# Patient Record
Sex: Male | Born: 1960 | Race: Black or African American | Hispanic: No | Marital: Single | Smoking: Former smoker
Health system: Southern US, Community
[De-identification: ages and names within clinical notes are randomized; demographics above are authoritative.]

## PROBLEM LIST (undated history)

## (undated) DIAGNOSIS — Z9889 Other specified postprocedural states: Secondary | ICD-10-CM

## (undated) DIAGNOSIS — M1990 Unspecified osteoarthritis, unspecified site: Secondary | ICD-10-CM

## (undated) DIAGNOSIS — G8929 Other chronic pain: Secondary | ICD-10-CM

## (undated) DIAGNOSIS — E785 Hyperlipidemia, unspecified: Secondary | ICD-10-CM

## (undated) DIAGNOSIS — Z91199 Patient's noncompliance with other medical treatment and regimen due to unspecified reason: Secondary | ICD-10-CM

## (undated) DIAGNOSIS — F329 Major depressive disorder, single episode, unspecified: Secondary | ICD-10-CM

## (undated) DIAGNOSIS — M722 Plantar fascial fibromatosis: Secondary | ICD-10-CM

## (undated) DIAGNOSIS — G4730 Sleep apnea, unspecified: Secondary | ICD-10-CM

## (undated) DIAGNOSIS — F419 Anxiety disorder, unspecified: Secondary | ICD-10-CM

## (undated) DIAGNOSIS — F32A Depression, unspecified: Secondary | ICD-10-CM

## (undated) DIAGNOSIS — R945 Abnormal results of liver function studies: Secondary | ICD-10-CM

## (undated) DIAGNOSIS — A64 Unspecified sexually transmitted disease: Secondary | ICD-10-CM

## (undated) DIAGNOSIS — M5126 Other intervertebral disc displacement, lumbar region: Secondary | ICD-10-CM

## (undated) DIAGNOSIS — I10 Essential (primary) hypertension: Secondary | ICD-10-CM

## (undated) DIAGNOSIS — F1010 Alcohol abuse, uncomplicated: Secondary | ICD-10-CM

## (undated) DIAGNOSIS — H409 Unspecified glaucoma: Secondary | ICD-10-CM

## (undated) DIAGNOSIS — I2510 Atherosclerotic heart disease of native coronary artery without angina pectoris: Secondary | ICD-10-CM

## (undated) DIAGNOSIS — E669 Obesity, unspecified: Secondary | ICD-10-CM

## (undated) DIAGNOSIS — Z9119 Patient's noncompliance with other medical treatment and regimen: Secondary | ICD-10-CM

## (undated) DIAGNOSIS — H269 Unspecified cataract: Secondary | ICD-10-CM

## (undated) HISTORY — PX: POLYPECTOMY: SHX149

## (undated) HISTORY — DX: Other specified postprocedural states: Z98.890

## (undated) HISTORY — DX: Patient's noncompliance with other medical treatment and regimen due to unspecified reason: Z91.199

## (undated) HISTORY — DX: Essential (primary) hypertension: I10

## (undated) HISTORY — DX: Patient's noncompliance with other medical treatment and regimen: Z91.19

## (undated) HISTORY — DX: Major depressive disorder, single episode, unspecified: F32.9

## (undated) HISTORY — DX: Atherosclerotic heart disease of native coronary artery without angina pectoris: I25.10

## (undated) HISTORY — DX: Unspecified cataract: H26.9

## (undated) HISTORY — DX: Other intervertebral disc displacement, lumbar region: M51.26

## (undated) HISTORY — DX: Anxiety disorder, unspecified: F41.9

## (undated) HISTORY — DX: Alcohol abuse, uncomplicated: F10.10

## (undated) HISTORY — DX: Plantar fascial fibromatosis: M72.2

## (undated) HISTORY — DX: Obesity, unspecified: E66.9

## (undated) HISTORY — DX: Hyperlipidemia, unspecified: E78.5

## (undated) HISTORY — PX: COLONOSCOPY: SHX174

## (undated) HISTORY — DX: Unspecified sexually transmitted disease: A64

## (undated) HISTORY — DX: Sleep apnea, unspecified: G47.30

## (undated) HISTORY — PX: APPENDECTOMY: SHX54

## (undated) HISTORY — DX: Depression, unspecified: F32.A

## (undated) HISTORY — DX: Unspecified glaucoma: H40.9

## (undated) HISTORY — DX: Abnormal results of liver function studies: R94.5

## (undated) HISTORY — DX: Unspecified osteoarthritis, unspecified site: M19.90

---

## 1998-06-22 ENCOUNTER — Emergency Department (HOSPITAL_COMMUNITY): Admission: EM | Admit: 1998-06-22 | Discharge: 1998-06-22 | Payer: Self-pay | Admitting: Emergency Medicine

## 1998-08-23 ENCOUNTER — Emergency Department (HOSPITAL_COMMUNITY): Admission: EM | Admit: 1998-08-23 | Discharge: 1998-08-23 | Payer: Self-pay | Admitting: *Deleted

## 1998-09-01 ENCOUNTER — Emergency Department (HOSPITAL_COMMUNITY): Admission: EM | Admit: 1998-09-01 | Discharge: 1998-09-01 | Payer: Self-pay | Admitting: Emergency Medicine

## 1998-09-05 ENCOUNTER — Emergency Department (HOSPITAL_COMMUNITY): Admission: EM | Admit: 1998-09-05 | Discharge: 1998-09-05 | Payer: Self-pay | Admitting: Emergency Medicine

## 1998-11-18 ENCOUNTER — Emergency Department (HOSPITAL_COMMUNITY): Admission: EM | Admit: 1998-11-18 | Discharge: 1998-11-18 | Payer: Self-pay | Admitting: Emergency Medicine

## 1999-08-26 ENCOUNTER — Emergency Department (HOSPITAL_COMMUNITY): Admission: EM | Admit: 1999-08-26 | Discharge: 1999-08-26 | Payer: Self-pay | Admitting: Emergency Medicine

## 1999-11-23 ENCOUNTER — Emergency Department (HOSPITAL_COMMUNITY): Admission: EM | Admit: 1999-11-23 | Discharge: 1999-11-23 | Payer: Self-pay | Admitting: Emergency Medicine

## 1999-11-30 ENCOUNTER — Emergency Department (HOSPITAL_COMMUNITY): Admission: EM | Admit: 1999-11-30 | Discharge: 1999-11-30 | Payer: Self-pay | Admitting: *Deleted

## 2000-05-23 ENCOUNTER — Emergency Department (HOSPITAL_COMMUNITY): Admission: EM | Admit: 2000-05-23 | Discharge: 2000-05-23 | Payer: Self-pay | Admitting: Emergency Medicine

## 2001-02-25 ENCOUNTER — Emergency Department (HOSPITAL_COMMUNITY): Admission: EM | Admit: 2001-02-25 | Discharge: 2001-02-25 | Payer: Self-pay | Admitting: Emergency Medicine

## 2001-06-12 ENCOUNTER — Emergency Department (HOSPITAL_COMMUNITY): Admission: EM | Admit: 2001-06-12 | Discharge: 2001-06-12 | Payer: Self-pay | Admitting: Emergency Medicine

## 2001-10-06 ENCOUNTER — Emergency Department: Admission: EM | Admit: 2001-10-06 | Discharge: 2001-10-06 | Payer: Self-pay | Admitting: Emergency Medicine

## 2001-12-03 ENCOUNTER — Emergency Department (HOSPITAL_COMMUNITY): Admission: EM | Admit: 2001-12-03 | Discharge: 2001-12-04 | Payer: Self-pay | Admitting: Emergency Medicine

## 2001-12-27 ENCOUNTER — Encounter: Payer: Self-pay | Admitting: Emergency Medicine

## 2001-12-27 ENCOUNTER — Emergency Department (HOSPITAL_COMMUNITY): Admission: EM | Admit: 2001-12-27 | Discharge: 2001-12-27 | Payer: Self-pay | Admitting: Emergency Medicine

## 2002-01-07 ENCOUNTER — Emergency Department (HOSPITAL_COMMUNITY): Admission: EM | Admit: 2002-01-07 | Discharge: 2002-01-07 | Payer: Self-pay | Admitting: Emergency Medicine

## 2002-10-20 ENCOUNTER — Emergency Department (HOSPITAL_COMMUNITY): Admission: EM | Admit: 2002-10-20 | Discharge: 2002-10-20 | Payer: Self-pay | Admitting: Emergency Medicine

## 2002-10-20 ENCOUNTER — Encounter: Payer: Self-pay | Admitting: Emergency Medicine

## 2002-11-12 ENCOUNTER — Encounter: Payer: Self-pay | Admitting: Emergency Medicine

## 2002-11-12 ENCOUNTER — Emergency Department (HOSPITAL_COMMUNITY): Admission: EM | Admit: 2002-11-12 | Discharge: 2002-11-13 | Payer: Self-pay | Admitting: Emergency Medicine

## 2003-07-04 ENCOUNTER — Emergency Department (HOSPITAL_COMMUNITY): Admission: EM | Admit: 2003-07-04 | Discharge: 2003-07-04 | Payer: Self-pay | Admitting: Emergency Medicine

## 2003-07-07 ENCOUNTER — Emergency Department (HOSPITAL_COMMUNITY): Admission: EM | Admit: 2003-07-07 | Discharge: 2003-07-07 | Payer: Self-pay

## 2003-07-31 ENCOUNTER — Emergency Department (HOSPITAL_COMMUNITY): Admission: EM | Admit: 2003-07-31 | Discharge: 2003-07-31 | Payer: Self-pay | Admitting: Emergency Medicine

## 2004-05-03 ENCOUNTER — Ambulatory Visit: Payer: Self-pay | Admitting: Family Medicine

## 2004-06-02 ENCOUNTER — Emergency Department (HOSPITAL_COMMUNITY): Admission: EM | Admit: 2004-06-02 | Discharge: 2004-06-02 | Payer: Self-pay | Admitting: Emergency Medicine

## 2004-06-22 ENCOUNTER — Ambulatory Visit: Payer: Self-pay | Admitting: Family Medicine

## 2004-09-17 ENCOUNTER — Emergency Department (HOSPITAL_COMMUNITY): Admission: EM | Admit: 2004-09-17 | Discharge: 2004-09-17 | Payer: Self-pay | Admitting: *Deleted

## 2004-12-15 ENCOUNTER — Emergency Department (HOSPITAL_COMMUNITY): Admission: EM | Admit: 2004-12-15 | Discharge: 2004-12-15 | Payer: Self-pay | Admitting: Emergency Medicine

## 2004-12-30 ENCOUNTER — Emergency Department (HOSPITAL_COMMUNITY): Admission: EM | Admit: 2004-12-30 | Discharge: 2004-12-30 | Payer: Self-pay | Admitting: Emergency Medicine

## 2005-03-14 ENCOUNTER — Inpatient Hospital Stay (HOSPITAL_COMMUNITY): Admission: EM | Admit: 2005-03-14 | Discharge: 2005-03-14 | Payer: Self-pay | Admitting: Emergency Medicine

## 2005-05-23 HISTORY — PX: CARDIAC CATHETERIZATION: SHX172

## 2005-07-27 ENCOUNTER — Ambulatory Visit: Payer: Self-pay | Admitting: Family Medicine

## 2005-09-05 ENCOUNTER — Ambulatory Visit: Payer: Self-pay | Admitting: Family Medicine

## 2005-09-12 ENCOUNTER — Ambulatory Visit: Payer: Self-pay | Admitting: Family Medicine

## 2005-09-19 ENCOUNTER — Ambulatory Visit: Payer: Self-pay | Admitting: Family Medicine

## 2005-10-21 ENCOUNTER — Emergency Department (HOSPITAL_COMMUNITY): Admission: EM | Admit: 2005-10-21 | Discharge: 2005-10-21 | Payer: Self-pay | Admitting: Emergency Medicine

## 2006-02-01 ENCOUNTER — Ambulatory Visit: Payer: Self-pay | Admitting: Family Medicine

## 2006-02-02 ENCOUNTER — Emergency Department (HOSPITAL_COMMUNITY): Admission: EM | Admit: 2006-02-02 | Discharge: 2006-02-02 | Payer: Self-pay | Admitting: Emergency Medicine

## 2006-02-14 ENCOUNTER — Ambulatory Visit: Payer: Self-pay | Admitting: *Deleted

## 2006-02-15 ENCOUNTER — Ambulatory Visit: Payer: Self-pay | Admitting: Family Medicine

## 2006-02-28 ENCOUNTER — Encounter: Admission: RE | Admit: 2006-02-28 | Discharge: 2006-03-15 | Payer: Self-pay | Admitting: Orthopedic Surgery

## 2006-03-22 ENCOUNTER — Ambulatory Visit: Payer: Self-pay | Admitting: Family Medicine

## 2006-04-02 ENCOUNTER — Ambulatory Visit: Payer: Self-pay | Admitting: Cardiovascular Disease

## 2006-04-02 ENCOUNTER — Inpatient Hospital Stay (HOSPITAL_COMMUNITY): Admission: EM | Admit: 2006-04-02 | Discharge: 2006-04-05 | Payer: Self-pay | Admitting: Emergency Medicine

## 2006-04-17 ENCOUNTER — Ambulatory Visit: Payer: Self-pay | Admitting: Cardiovascular Disease

## 2006-04-18 ENCOUNTER — Ambulatory Visit (HOSPITAL_COMMUNITY): Admission: RE | Admit: 2006-04-18 | Discharge: 2006-04-18 | Payer: Self-pay | Admitting: Cardiovascular Disease

## 2006-04-25 ENCOUNTER — Ambulatory Visit: Payer: Self-pay | Admitting: Family Medicine

## 2006-05-04 ENCOUNTER — Ambulatory Visit: Payer: Self-pay | Admitting: Family Medicine

## 2006-05-22 ENCOUNTER — Ambulatory Visit: Payer: Self-pay | Admitting: Family Medicine

## 2006-05-27 ENCOUNTER — Emergency Department (HOSPITAL_COMMUNITY): Admission: EM | Admit: 2006-05-27 | Discharge: 2006-05-27 | Payer: Self-pay | Admitting: Emergency Medicine

## 2006-06-03 ENCOUNTER — Emergency Department (HOSPITAL_COMMUNITY): Admission: EM | Admit: 2006-06-03 | Discharge: 2006-06-03 | Payer: Self-pay | Admitting: Emergency Medicine

## 2006-06-16 ENCOUNTER — Emergency Department (HOSPITAL_COMMUNITY): Admission: EM | Admit: 2006-06-16 | Discharge: 2006-06-17 | Payer: Self-pay | Admitting: Emergency Medicine

## 2006-06-20 ENCOUNTER — Ambulatory Visit: Payer: Self-pay | Admitting: Family Medicine

## 2006-06-21 ENCOUNTER — Ambulatory Visit: Payer: Self-pay | Admitting: Family Medicine

## 2006-07-17 ENCOUNTER — Encounter: Admission: RE | Admit: 2006-07-17 | Discharge: 2006-08-09 | Payer: Self-pay | Admitting: Orthopedic Surgery

## 2006-10-02 ENCOUNTER — Emergency Department (HOSPITAL_COMMUNITY): Admission: EM | Admit: 2006-10-02 | Discharge: 2006-10-02 | Payer: Self-pay | Admitting: Emergency Medicine

## 2006-10-22 IMAGING — CR DG CHEST 2V
2 series · 2 of 2 positions shown · non-contrast
Comparison: none

CLINICAL DATA: accidentally ingested rat poisoning
 TWO VIEW CHEST:
 There are mildly accentuated bronchovascular markings.  There are no infiltrates and the heart and mediastinal structures are normal.

[view not recorded (1 of 2)]
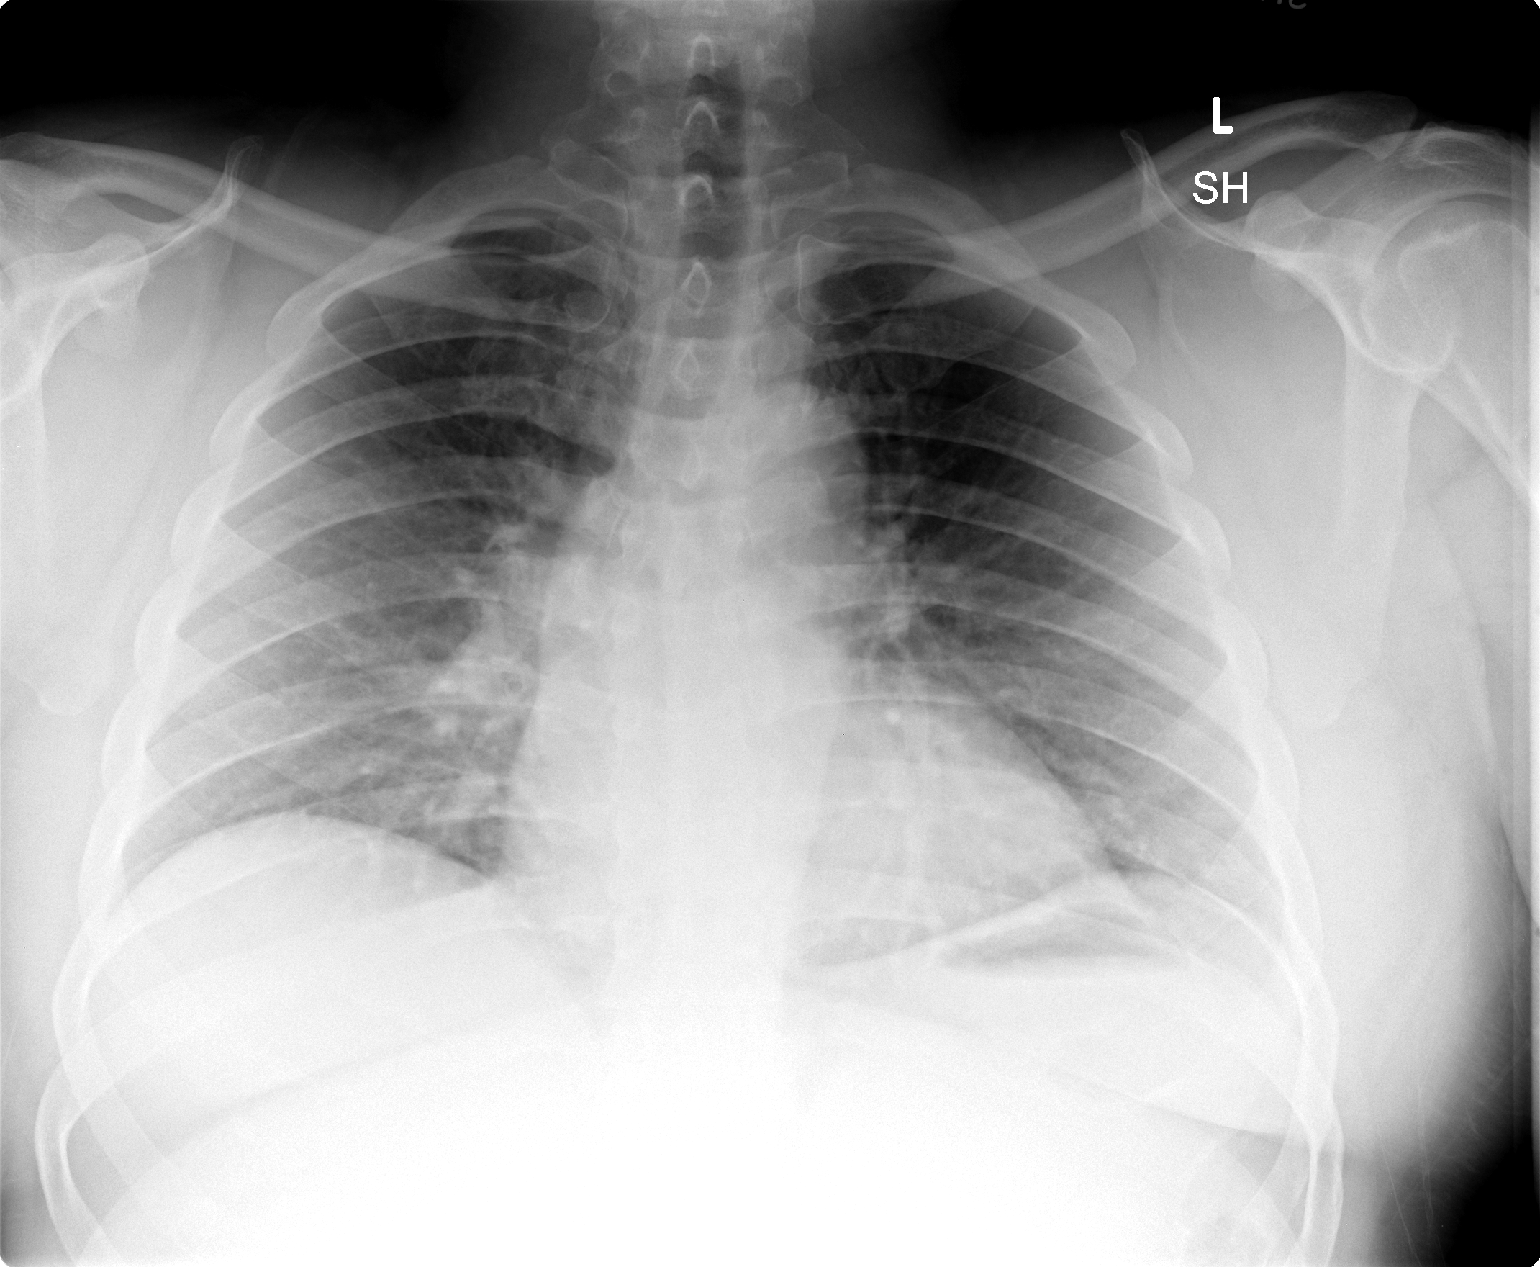

[view not recorded (2 of 2)]
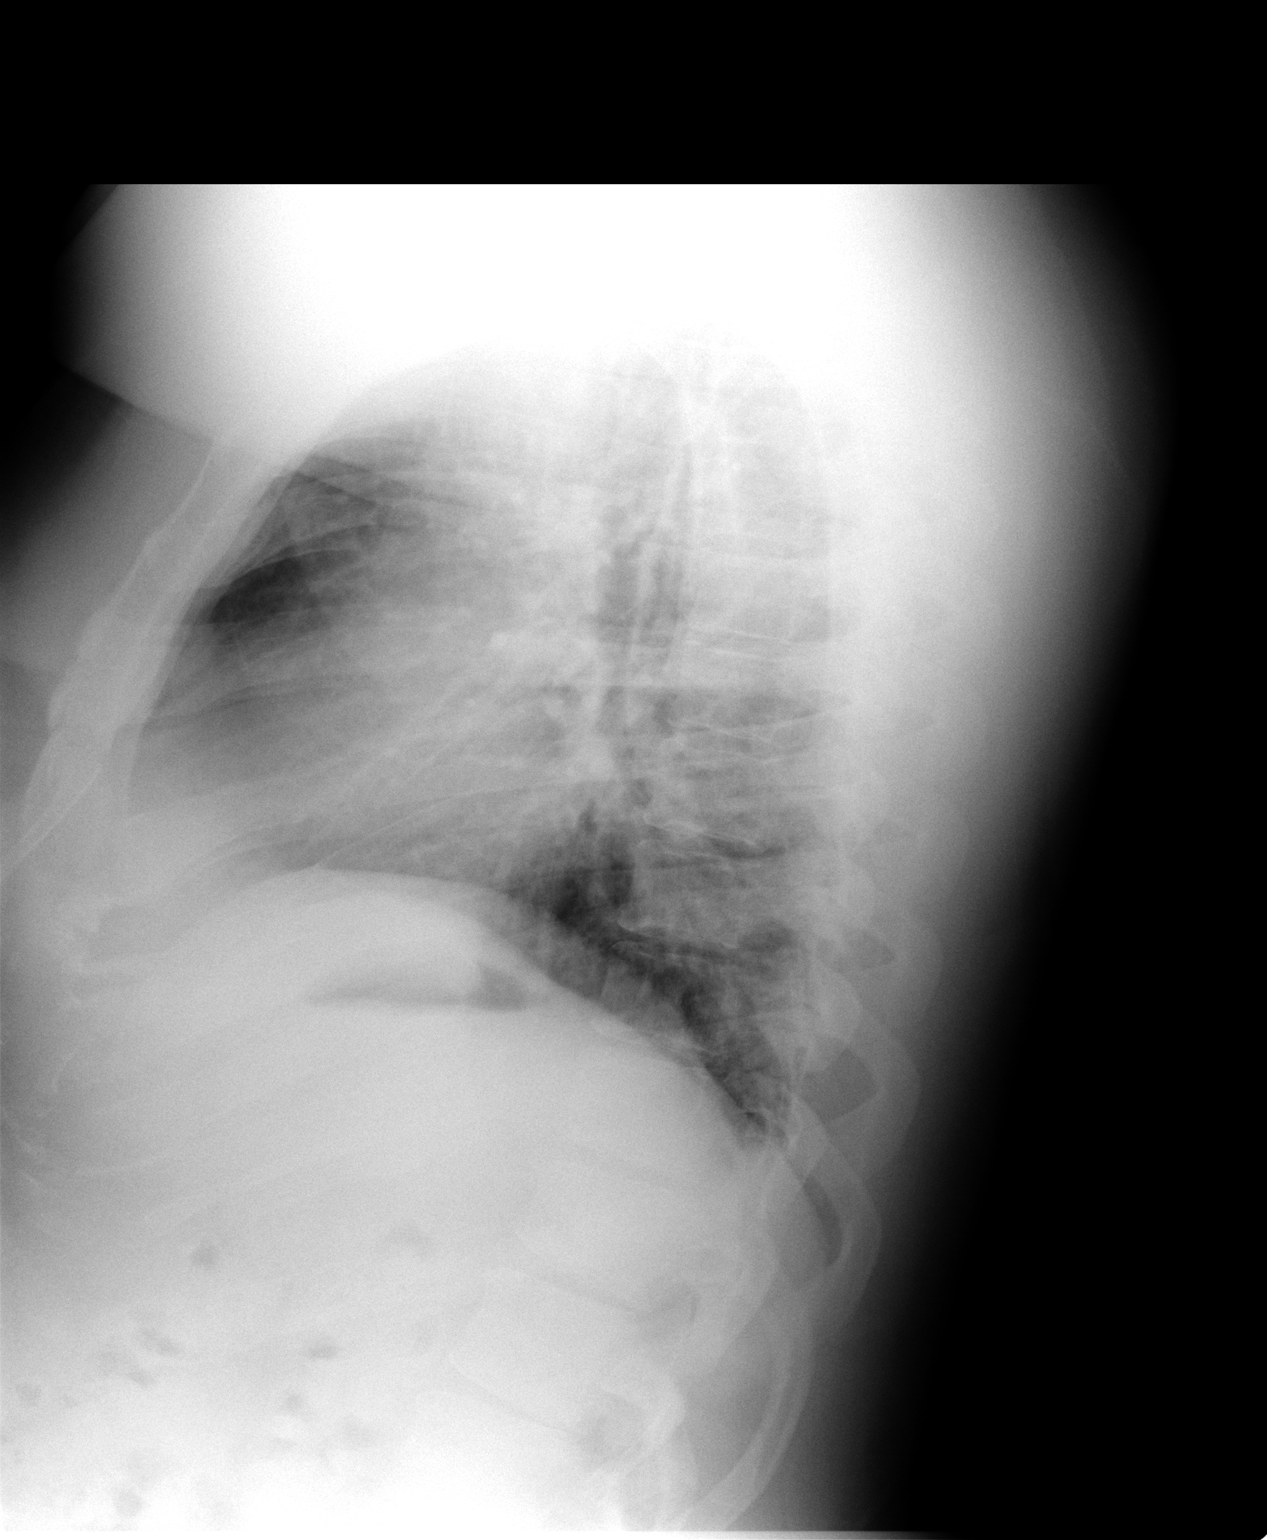

[2 of 2 positions shown; findings below may reference images not displayed]

IMPRESSION: Mildly accentuated bronchovascular markings.  No acute infiltrates.

## 2006-10-22 IMAGING — CT CT HEAD W/O CM
1 series · 16 of 30 positions shown, 20 images · IV contrast (agent unspecified)
Comparison: 07/07/03
 Routine noncontrast head CT was performed.

CLINICAL DATA: ingested rat poison; left-sided numbness
 CT HEAD WITHOUT CONTRAST:

[Series 2: head routi 5.0 h30s · axial · 0.43mm/px · z∈[+1079,+1219]mm · 16 of 32 slices shown, 20 images]
[im 2/32  brain]
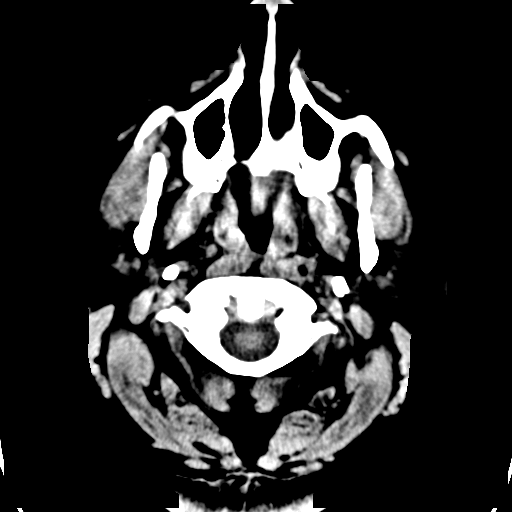
[im 2/32  bone]
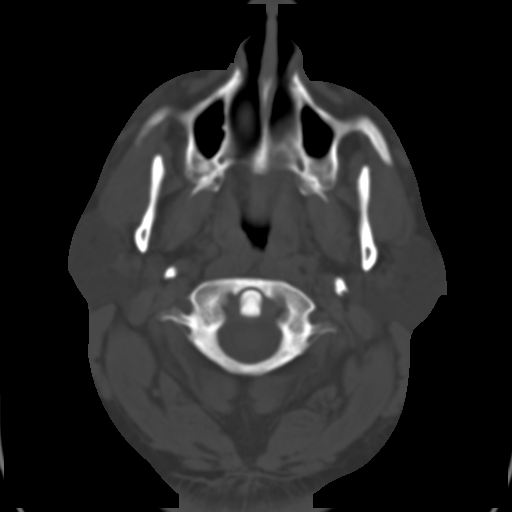
[im 4/32  brain]
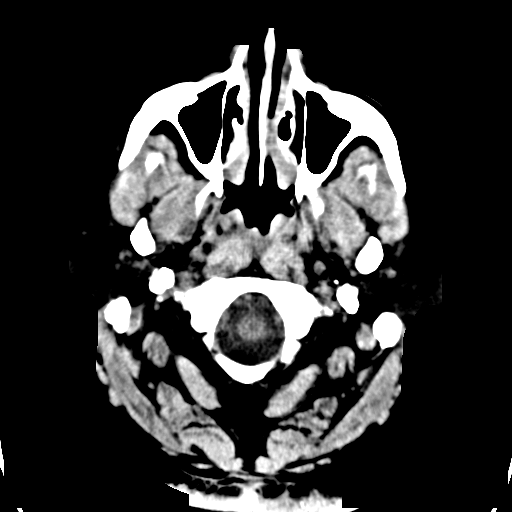
[im 6/32  brain]
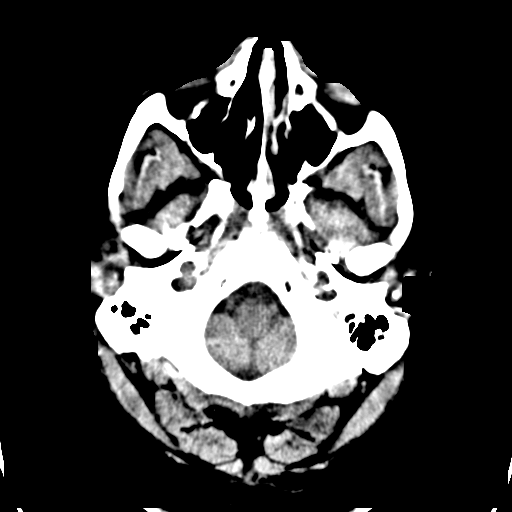
[im 8/32  brain]
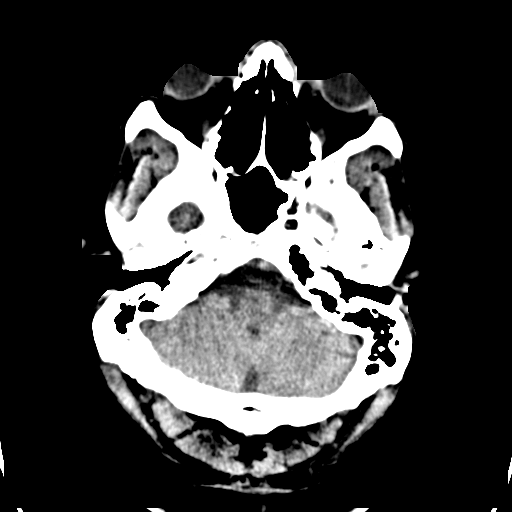
[im 9/32  brain]
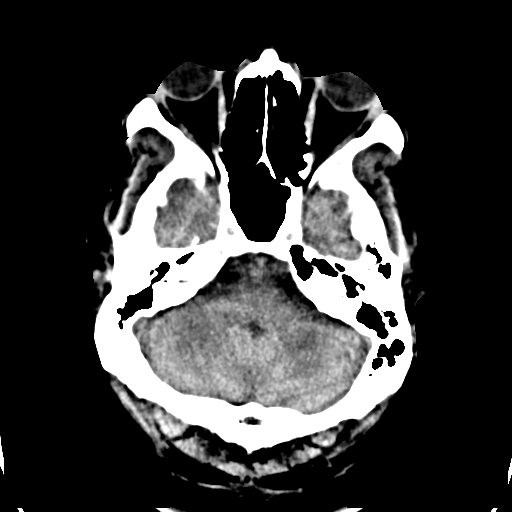
[im 9/32  bone]
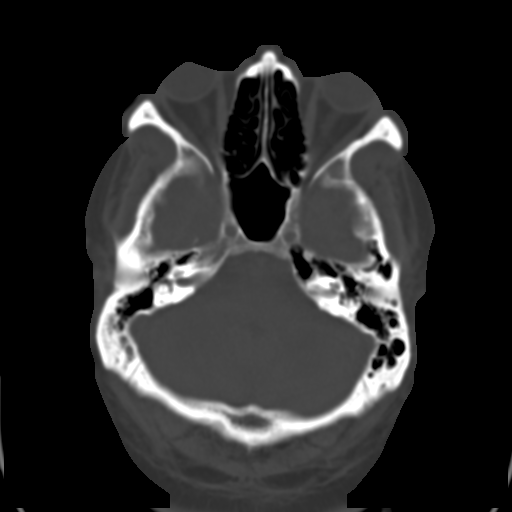
[im 11/32  brain]
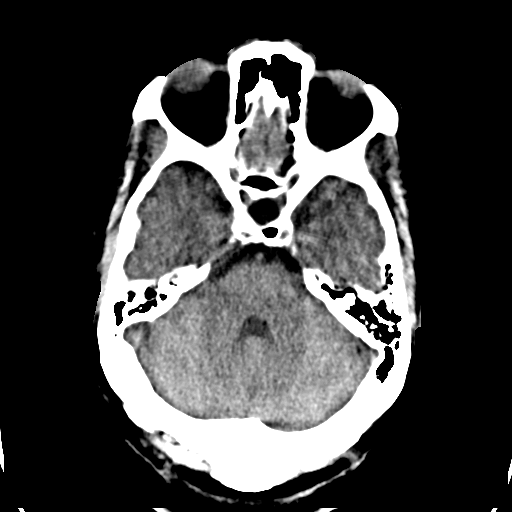
[im 13/32  brain]
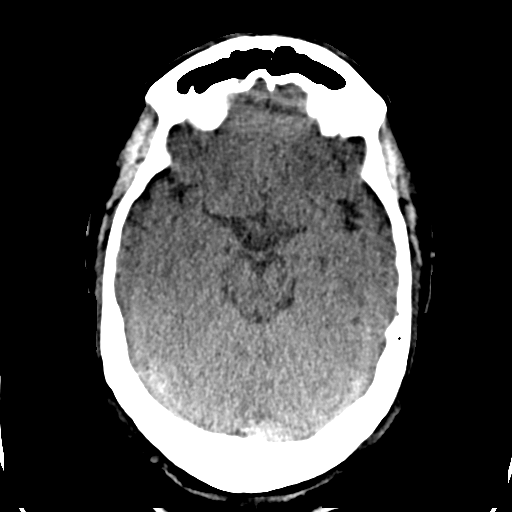
[im 15/32  brain]
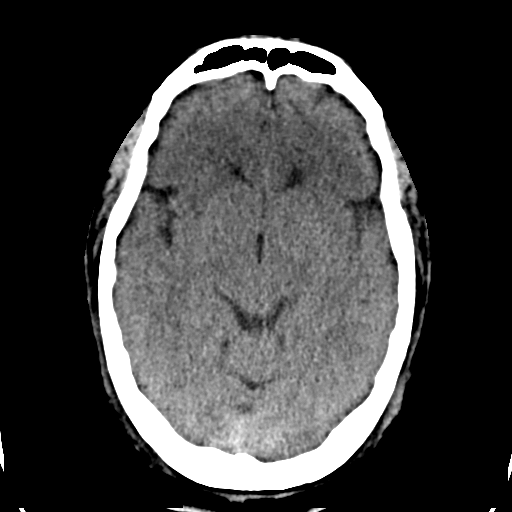
[im 17/32  brain]
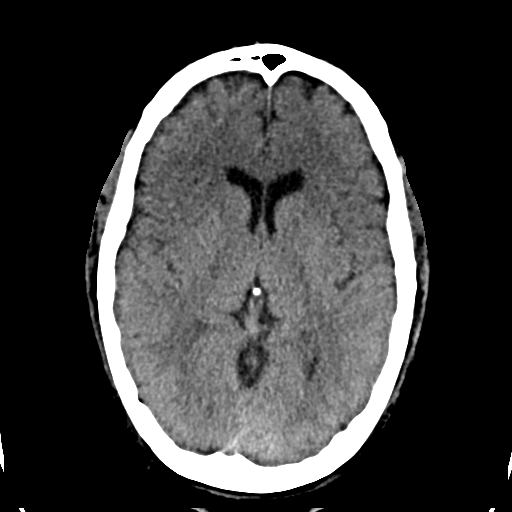
[im 17/32  bone]
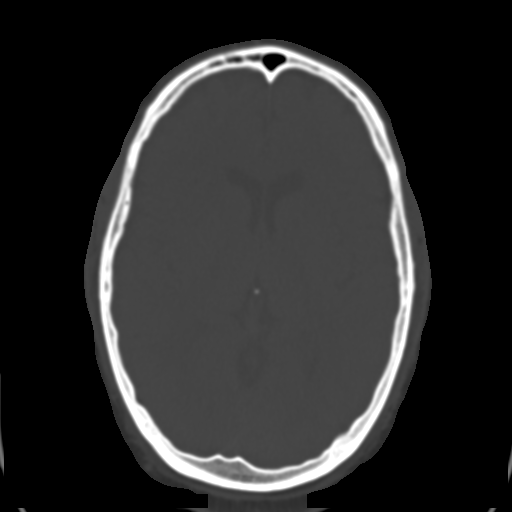
[im 19/32  brain]
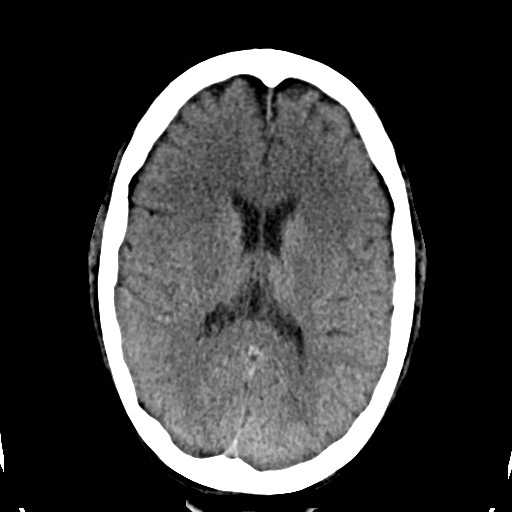
[im 21/32  brain]
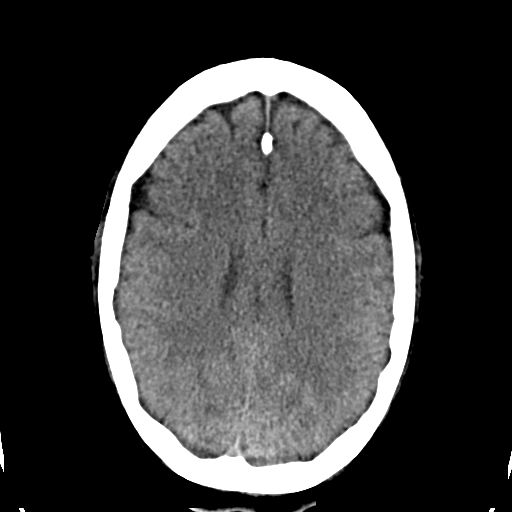
[im 23/32  brain]
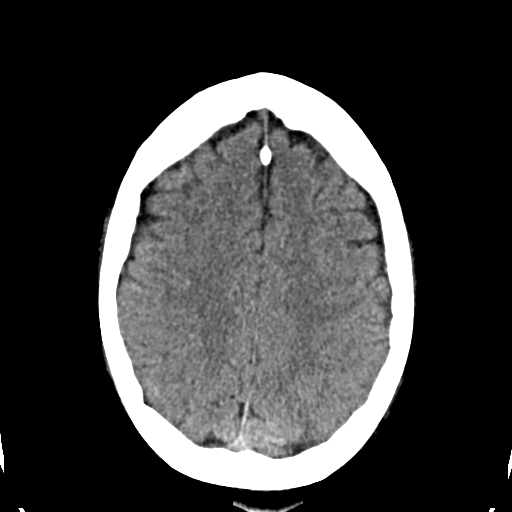
[im 24/32  brain]
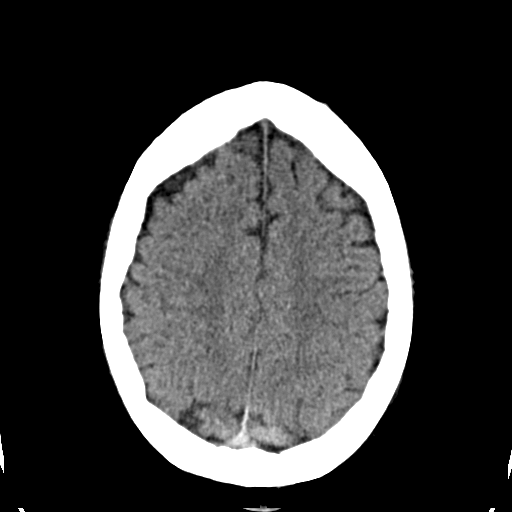
[im 24/32  bone]
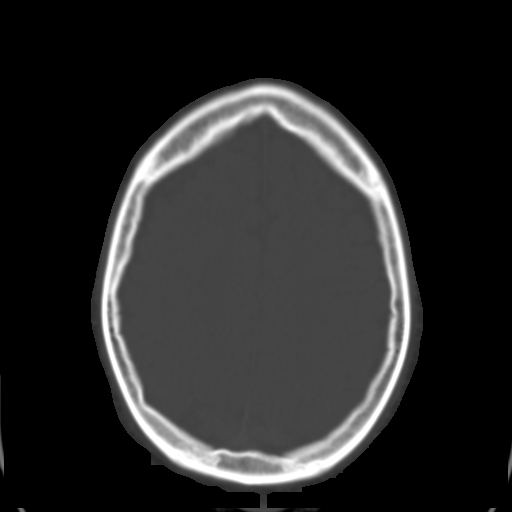
[im 26/32  brain]
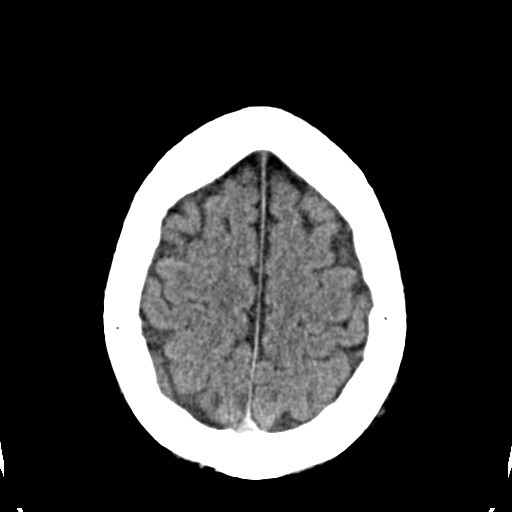
[im 28/32  brain]
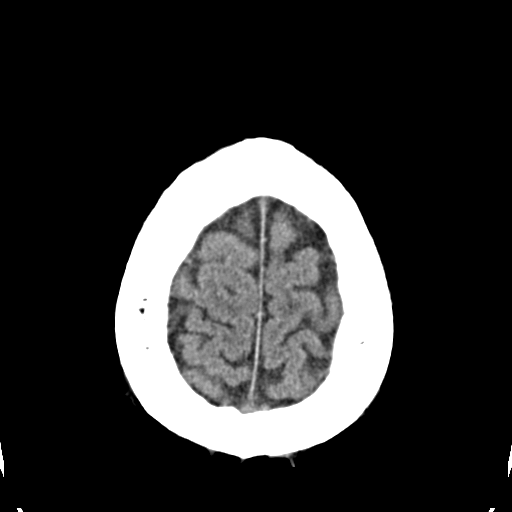
[im 30/32  brain]
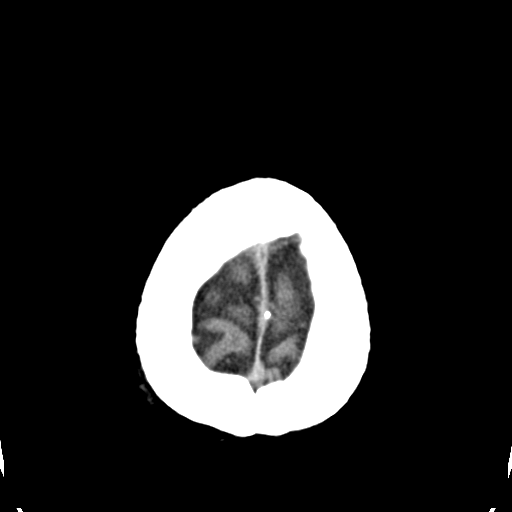

[16 of 30 positions shown; findings below may reference images not displayed]

There is no evidence of intracranial hemorrhage, brain edema, or mass effect. The ventricles are normal. No extra-axial abnormalities are identified. Bone windows show no significant abnormalities.
IMPRESSION: Negative noncontrast head CT.

## 2006-10-28 ENCOUNTER — Emergency Department (HOSPITAL_COMMUNITY): Admission: EM | Admit: 2006-10-28 | Discharge: 2006-10-29 | Payer: Self-pay | Admitting: Emergency Medicine

## 2006-11-01 DIAGNOSIS — M5126 Other intervertebral disc displacement, lumbar region: Secondary | ICD-10-CM

## 2006-11-01 DIAGNOSIS — E119 Type 2 diabetes mellitus without complications: Secondary | ICD-10-CM

## 2006-11-01 DIAGNOSIS — Z9119 Patient's noncompliance with other medical treatment and regimen: Secondary | ICD-10-CM

## 2006-11-01 DIAGNOSIS — A64 Unspecified sexually transmitted disease: Secondary | ICD-10-CM | POA: Insufficient documentation

## 2006-11-01 DIAGNOSIS — F1920 Other psychoactive substance dependence, uncomplicated: Secondary | ICD-10-CM | POA: Insufficient documentation

## 2006-11-01 DIAGNOSIS — M722 Plantar fascial fibromatosis: Secondary | ICD-10-CM | POA: Insufficient documentation

## 2006-11-01 DIAGNOSIS — R945 Abnormal results of liver function studies: Secondary | ICD-10-CM | POA: Insufficient documentation

## 2006-11-01 DIAGNOSIS — I10 Essential (primary) hypertension: Secondary | ICD-10-CM

## 2006-11-01 DIAGNOSIS — F1010 Alcohol abuse, uncomplicated: Secondary | ICD-10-CM | POA: Insufficient documentation

## 2006-11-01 DIAGNOSIS — Z981 Arthrodesis status: Secondary | ICD-10-CM

## 2006-11-14 ENCOUNTER — Emergency Department (HOSPITAL_COMMUNITY): Admission: EM | Admit: 2006-11-14 | Discharge: 2006-11-14 | Payer: Self-pay | Admitting: Emergency Medicine

## 2006-11-28 ENCOUNTER — Ambulatory Visit: Payer: Self-pay | Admitting: Internal Medicine

## 2006-12-02 ENCOUNTER — Observation Stay (HOSPITAL_COMMUNITY): Admission: EM | Admit: 2006-12-02 | Discharge: 2006-12-03 | Payer: Self-pay | Admitting: Emergency Medicine

## 2007-01-15 ENCOUNTER — Ambulatory Visit: Payer: Self-pay | Admitting: Internal Medicine

## 2007-01-15 LAB — CONVERTED CEMR LAB
ALT: 37 units/L (ref 0–53)
AST: 30 units/L (ref 0–37)
Albumin: 4.1 g/dL (ref 3.5–5.2)
Alkaline Phosphatase: 86 units/L (ref 39–117)
BUN: 11 mg/dL (ref 6–23)
CO2: 25 meq/L (ref 19–32)
Calcium: 9.3 mg/dL (ref 8.4–10.5)
Chloride: 104 meq/L (ref 96–112)
Cholesterol: 164 mg/dL (ref 0–200)
Creatinine, Ser: 0.82 mg/dL (ref 0.40–1.50)
Glucose, Bld: 107 mg/dL — ABNORMAL HIGH (ref 70–99)
HDL: 40 mg/dL (ref >39)
LDL Cholesterol: 106 mg/dL — ABNORMAL HIGH (ref 0–99)
Microalb, Ur: 1.68 mg/dL (ref 0.00–1.89)
Potassium: 4.2 meq/L (ref 3.5–5.3)
Sodium: 143 meq/L (ref 135–145)
Total Bilirubin: 0.4 mg/dL (ref 0.3–1.2)
Total CHOL/HDL Ratio: 4.1
Total Protein: 7.3 g/dL (ref 6.0–8.3)
Triglycerides: 92 mg/dL (ref <150)
VLDL: 18 mg/dL (ref 0–40)

## 2007-02-07 ENCOUNTER — Encounter (INDEPENDENT_AMBULATORY_CARE_PROVIDER_SITE_OTHER): Payer: Self-pay | Admitting: *Deleted

## 2007-03-21 ENCOUNTER — Emergency Department (HOSPITAL_COMMUNITY): Admission: EM | Admit: 2007-03-21 | Discharge: 2007-03-21 | Payer: Self-pay | Admitting: Emergency Medicine

## 2007-03-30 ENCOUNTER — Ambulatory Visit: Payer: Self-pay | Admitting: Internal Medicine

## 2007-05-09 ENCOUNTER — Encounter (INDEPENDENT_AMBULATORY_CARE_PROVIDER_SITE_OTHER): Payer: Self-pay | Admitting: Family Medicine

## 2007-05-09 ENCOUNTER — Ambulatory Visit: Payer: Self-pay | Admitting: Internal Medicine

## 2007-07-01 ENCOUNTER — Emergency Department (HOSPITAL_COMMUNITY): Admission: EM | Admit: 2007-07-01 | Discharge: 2007-07-01 | Payer: Self-pay | Admitting: Emergency Medicine

## 2007-07-05 ENCOUNTER — Emergency Department (HOSPITAL_COMMUNITY): Admission: EM | Admit: 2007-07-05 | Discharge: 2007-07-05 | Payer: Self-pay | Admitting: Emergency Medicine

## 2007-07-20 ENCOUNTER — Encounter (INDEPENDENT_AMBULATORY_CARE_PROVIDER_SITE_OTHER): Payer: Self-pay | Admitting: Family Medicine

## 2007-07-20 ENCOUNTER — Ambulatory Visit: Payer: Self-pay | Admitting: Internal Medicine

## 2007-07-20 LAB — CONVERTED CEMR LAB
Albumin: 4.2 g/dL (ref 3.5–5.2)
BUN: 8 mg/dL (ref 6–23)
Calcium: 9.5 mg/dL (ref 8.4–10.5)
Chloride: 101 meq/L (ref 96–112)
Cholesterol: 197 mg/dL (ref 0–200)
Creatinine, Ser: 0.91 mg/dL (ref 0.40–1.50)
Glucose, Bld: 179 mg/dL — ABNORMAL HIGH (ref 70–99)
HDL: 43 mg/dL (ref >39)
Potassium: 4.2 meq/L (ref 3.5–5.3)
Total CHOL/HDL Ratio: 4.6
Triglycerides: 167 mg/dL — ABNORMAL HIGH (ref <150)

## 2007-08-10 ENCOUNTER — Ambulatory Visit: Payer: Self-pay | Admitting: Family Medicine

## 2007-08-20 ENCOUNTER — Encounter: Admission: RE | Admit: 2007-08-20 | Discharge: 2007-08-20 | Payer: Self-pay | Admitting: Sports Medicine

## 2007-09-11 ENCOUNTER — Encounter: Admission: RE | Admit: 2007-09-11 | Discharge: 2007-10-10 | Payer: Self-pay | Admitting: Sports Medicine

## 2007-09-19 ENCOUNTER — Emergency Department (HOSPITAL_COMMUNITY): Admission: EM | Admit: 2007-09-19 | Discharge: 2007-09-19 | Payer: Self-pay | Admitting: Emergency Medicine

## 2007-10-17 ENCOUNTER — Ambulatory Visit: Payer: Self-pay | Admitting: Internal Medicine

## 2007-10-30 ENCOUNTER — Ambulatory Visit: Payer: Self-pay | Admitting: Internal Medicine

## 2007-10-30 ENCOUNTER — Encounter (INDEPENDENT_AMBULATORY_CARE_PROVIDER_SITE_OTHER): Payer: Self-pay | Admitting: Family Medicine

## 2007-10-30 LAB — CONVERTED CEMR LAB
ALT: 34 units/L (ref 0–53)
BUN: 8 mg/dL (ref 6–23)
CO2: 26 meq/L (ref 19–32)
Calcium: 9.2 mg/dL (ref 8.4–10.5)
Chloride: 108 meq/L (ref 96–112)
Cholesterol: 207 mg/dL — ABNORMAL HIGH (ref 0–200)
Creatinine, Ser: 0.87 mg/dL (ref 0.40–1.50)
Glucose, Bld: 109 mg/dL — ABNORMAL HIGH (ref 70–99)
HDL: 48 mg/dL (ref >39)
Total CHOL/HDL Ratio: 4.3
Triglycerides: 117 mg/dL (ref <150)

## 2007-12-02 ENCOUNTER — Emergency Department (HOSPITAL_COMMUNITY): Admission: EM | Admit: 2007-12-02 | Discharge: 2007-12-02 | Payer: Self-pay | Admitting: Emergency Medicine

## 2007-12-12 ENCOUNTER — Emergency Department (HOSPITAL_COMMUNITY): Admission: EM | Admit: 2007-12-12 | Discharge: 2007-12-12 | Payer: Self-pay | Admitting: Emergency Medicine

## 2008-02-07 ENCOUNTER — Ambulatory Visit: Payer: Self-pay | Admitting: Internal Medicine

## 2008-02-09 ENCOUNTER — Emergency Department (HOSPITAL_COMMUNITY): Admission: EM | Admit: 2008-02-09 | Discharge: 2008-02-09 | Payer: Self-pay | Admitting: Emergency Medicine

## 2008-02-15 ENCOUNTER — Ambulatory Visit: Payer: Self-pay | Admitting: Internal Medicine

## 2008-02-27 ENCOUNTER — Ambulatory Visit: Payer: Self-pay | Admitting: Internal Medicine

## 2008-03-18 ENCOUNTER — Encounter: Admission: RE | Admit: 2008-03-18 | Discharge: 2008-04-29 | Payer: Self-pay | Admitting: Orthopedic Surgery

## 2008-04-21 ENCOUNTER — Ambulatory Visit: Payer: Self-pay | Admitting: Internal Medicine

## 2008-10-29 ENCOUNTER — Encounter: Payer: Self-pay | Admitting: Internal Medicine

## 2008-11-05 DIAGNOSIS — E785 Hyperlipidemia, unspecified: Secondary | ICD-10-CM

## 2008-11-05 DIAGNOSIS — E669 Obesity, unspecified: Secondary | ICD-10-CM | POA: Insufficient documentation

## 2008-11-05 DIAGNOSIS — I2510 Atherosclerotic heart disease of native coronary artery without angina pectoris: Secondary | ICD-10-CM

## 2008-11-10 ENCOUNTER — Ambulatory Visit: Payer: Self-pay | Admitting: Internal Medicine

## 2008-11-10 ENCOUNTER — Telehealth: Payer: Self-pay | Admitting: Internal Medicine

## 2008-11-10 ENCOUNTER — Encounter: Payer: Self-pay | Admitting: Cardiovascular Disease

## 2008-11-12 ENCOUNTER — Ambulatory Visit (HOSPITAL_BASED_OUTPATIENT_CLINIC_OR_DEPARTMENT_OTHER): Admission: RE | Admit: 2008-11-12 | Discharge: 2008-11-12 | Payer: Self-pay | Admitting: Internal Medicine

## 2008-11-12 ENCOUNTER — Encounter: Payer: Self-pay | Admitting: Pulmonary Disease

## 2008-11-23 ENCOUNTER — Ambulatory Visit: Payer: Self-pay | Admitting: Pulmonary Disease

## 2008-12-04 ENCOUNTER — Telehealth (INDEPENDENT_AMBULATORY_CARE_PROVIDER_SITE_OTHER): Payer: Self-pay | Admitting: *Deleted

## 2008-12-05 ENCOUNTER — Ambulatory Visit: Payer: Self-pay | Admitting: Internal Medicine

## 2008-12-08 ENCOUNTER — Emergency Department (HOSPITAL_COMMUNITY): Admission: EM | Admit: 2008-12-08 | Discharge: 2008-12-08 | Payer: Self-pay | Admitting: Emergency Medicine

## 2008-12-08 LAB — CONVERTED CEMR LAB
AST: 32 units/L (ref 0–37)
LDL Cholesterol: 63 mg/dL (ref 0–99)
Total CHOL/HDL Ratio: 3
Triglycerides: 63 mg/dL (ref 0.0–149.0)

## 2008-12-24 ENCOUNTER — Ambulatory Visit: Payer: Self-pay | Admitting: Pulmonary Disease

## 2008-12-24 DIAGNOSIS — G4733 Obstructive sleep apnea (adult) (pediatric): Secondary | ICD-10-CM

## 2008-12-31 ENCOUNTER — Telehealth (INDEPENDENT_AMBULATORY_CARE_PROVIDER_SITE_OTHER): Payer: Self-pay | Admitting: *Deleted

## 2009-01-30 ENCOUNTER — Ambulatory Visit: Payer: Self-pay | Admitting: Pulmonary Disease

## 2009-04-03 ENCOUNTER — Encounter (HOSPITAL_BASED_OUTPATIENT_CLINIC_OR_DEPARTMENT_OTHER)
Admission: RE | Admit: 2009-04-03 | Discharge: 2009-06-03 | Payer: Self-pay | Source: Home / Self Care | Admitting: General Surgery

## 2009-04-14 ENCOUNTER — Ambulatory Visit: Admission: RE | Admit: 2009-04-14 | Discharge: 2009-04-14 | Payer: Self-pay | Admitting: General Surgery

## 2009-04-14 ENCOUNTER — Encounter (HOSPITAL_BASED_OUTPATIENT_CLINIC_OR_DEPARTMENT_OTHER): Payer: Self-pay | Admitting: General Surgery

## 2009-04-14 ENCOUNTER — Ambulatory Visit: Payer: Self-pay | Admitting: Vascular Surgery

## 2009-04-14 ENCOUNTER — Encounter: Admission: RE | Admit: 2009-04-14 | Discharge: 2009-05-19 | Payer: Self-pay | Admitting: Orthopedic Surgery

## 2009-05-30 ENCOUNTER — Encounter: Payer: Self-pay | Admitting: Pulmonary Disease

## 2009-06-23 ENCOUNTER — Ambulatory Visit: Payer: Self-pay | Admitting: Pulmonary Disease

## 2009-10-08 ENCOUNTER — Ambulatory Visit (HOSPITAL_COMMUNITY): Admission: RE | Admit: 2009-10-08 | Discharge: 2009-10-09 | Payer: Self-pay | Admitting: Orthopedic Surgery

## 2009-10-12 ENCOUNTER — Emergency Department (HOSPITAL_COMMUNITY): Admission: EM | Admit: 2009-10-12 | Discharge: 2009-10-12 | Payer: Self-pay | Admitting: Emergency Medicine

## 2009-10-13 ENCOUNTER — Telehealth (INDEPENDENT_AMBULATORY_CARE_PROVIDER_SITE_OTHER): Payer: Self-pay | Admitting: *Deleted

## 2009-10-16 ENCOUNTER — Telehealth: Payer: Self-pay | Admitting: Internal Medicine

## 2009-10-19 ENCOUNTER — Encounter: Payer: Self-pay | Admitting: Internal Medicine

## 2009-11-13 ENCOUNTER — Ambulatory Visit: Payer: Self-pay | Admitting: Internal Medicine

## 2010-01-13 ENCOUNTER — Encounter: Admission: RE | Admit: 2010-01-13 | Discharge: 2010-02-18 | Payer: Self-pay | Admitting: Internal Medicine

## 2010-01-13 ENCOUNTER — Encounter: Payer: Self-pay | Admitting: Internal Medicine

## 2010-02-15 ENCOUNTER — Encounter (INDEPENDENT_AMBULATORY_CARE_PROVIDER_SITE_OTHER): Payer: Self-pay | Admitting: *Deleted

## 2010-03-11 ENCOUNTER — Encounter (INDEPENDENT_AMBULATORY_CARE_PROVIDER_SITE_OTHER): Payer: Self-pay | Admitting: *Deleted

## 2010-03-15 ENCOUNTER — Ambulatory Visit: Payer: Self-pay | Admitting: Gastroenterology

## 2010-03-31 ENCOUNTER — Telehealth: Payer: Self-pay | Admitting: Gastroenterology

## 2010-03-31 ENCOUNTER — Ambulatory Visit: Payer: Self-pay | Admitting: Gastroenterology

## 2010-05-12 ENCOUNTER — Ambulatory Visit: Payer: Self-pay | Admitting: Gastroenterology

## 2010-05-14 ENCOUNTER — Encounter: Payer: Self-pay | Admitting: Gastroenterology

## 2010-06-23 ENCOUNTER — Ambulatory Visit: Admit: 2010-06-23 | Payer: Self-pay | Admitting: Pulmonary Disease

## 2010-06-23 ENCOUNTER — Ambulatory Visit: Payer: Self-pay | Admitting: Pulmonary Disease

## 2010-06-24 ENCOUNTER — Telehealth: Payer: Self-pay | Admitting: Pulmonary Disease

## 2010-06-24 NOTE — Progress Notes (Signed)
Summary: Same day resch colonoscopy  Phone Note Outgoing Call   Call placed by: Alden Hipp,  March 31, 2010 8:24 AM Call placed to: Patient Summary of Call: Spoke with pt concerning todays COL appt and he said he thought it was scheduled for Apr 12, 2011 and he had not prepped. I transferred him downstairs to reschedule.  Follow-up for Phone Call        Pt. was transferred and r/s his COL for 05-12-10. He said he "forgot" about the appt. and did not prep. Would you like pt. charged the cancelation fee?  Follow-up by: Karna Christmas,  March 31, 2010 8:31 AM  Additional Follow-up for Phone Call Additional follow up Details #1::        all of the patients paper work says 03-31-2010 Additional Follow-up by: Harlow Mares CMA Duncan Dull),  March 31, 2010 8:32 AM    Additional Follow-up for Phone Call Additional follow up Details #2::    YES .Marland KitchenMarland KitchenHE SHOULD BE CHARGED.. Follow-up by: Mardella Layman MD Lonia Skinner 8:43 AM  Additional Follow-up for Phone Call Additional follow up Details #3:: Details for Additional Follow-up Action Taken: Patient BILLED. Additional Follow-up by: Leanor Kail [facility],  April 02, 2010 8:30 AM

## 2010-06-24 NOTE — Assessment & Plan Note (Signed)
Summary: rov for osa   Copy to:  Dietrich Pates Primary Provider/Referring Provider:  Concepcion Elk  CC:  Pt here for follow up. Pt c/o CPAP machine pressure being too much and unable to use every night. .  History of Present Illness: The pt comes in today for f/u of his osa.  His pressure was optimized to 13cm, but he is having issues tolerating this.  He had no issues tolerating the 10cm we initially started with.  He is unaware of his ramp setting, or how to consistently use ramp feature.  He feels cpap is definitely helping his sleep, but is unable to tolerate current pressure level.  He denies any issues with mask.  Current Medications (verified): 1)  Glucophage 500 Mg Tabs (Metformin Hcl) .... One Tab Two Times A Day 2)  Aspir-Low 81 Mg Tbec (Aspirin) .... Take One (1) Tablet Each Day 3)  Sertraline Hcl 100 Mg Tabs (Sertraline Hcl) .... Take 2 Tablet By Mouth Once A Day 4)  Doxycycline Hyclate 100 Mg Caps (Doxycycline Hyclate) .... As Needed 5)  Ibuprofen 800 Mg Tabs (Ibuprofen) .... As Needed 6)  Lipitor 20 Mg Tabs (Atorvastatin Calcium) .... Take One Tablet By Mouth Daily. 7)  Trazodone Hcl 100 Mg Tabs (Trazodone Hcl) .... Take 1 Tab By Mouth At Bedtime 8)  Cilndamycian .... Prn 9)  Cod Liver Oil  Caps (Cod Liver Oil) .... Take 1 Tablet By Mouth Once A Day 10)  Centrum Silver Ultra Mens  Tabs (Multiple Vitamins-Minerals) .... Take 1 Tablet By Mouth Once A Day  Allergies (verified): 1)  ! * Shellfish  Review of Systems      See HPI  Vital Signs:  Patient profile:   50 year old male Height:      66 inches Weight:      271 pounds O2 Sat:      96 % on Room air Temp:     97.7 degrees F oral Pulse rate:   87 / minute BP sitting:   110 / 76  (left arm) Cuff size:   large  Vitals Entered By: Zackery Barefoot CMA (June 23, 2009 10:50 AM)  O2 Flow:  Room air CC: Pt here for follow up. Pt c/o CPAP machine pressure being too much and unable to use every night.  Comments Medications  reviewed with patient Verified pt's contact number Zackery Barefoot CMA  June 23, 2009 11:03 AM    Physical Exam  General:  ow male in nad Nose:  no skin breakdown or pressure necrosis from cpap mask Neurologic:  alert, not sleepy, moves all 4.   Impression & Recommendations:  Problem # 1:  OBSTRUCTIVE SLEEP APNEA (ICD-327.23) the pt did very well with cpap at 10cm, but is having difficulty tolerating his optimized pressure of 13cm.  He is not using the ramp, and I wonder if he would do better with this set at .  Will go ahead and do that first, and if he still can't tolerate, will decrease his pressure.  I have also encouraged him to work aggressively on weight loss.  Medications Added to Medication List This Visit: 1)  Sertraline Hcl 100 Mg Tabs (Sertraline hcl) .... Take 2 tablet by mouth once a day 2)  Trazodone Hcl 100 Mg Tabs (Trazodone hcl) .... Take 1 tab by mouth at bedtime 3)  Cod Liver Oil Caps (Cod liver oil) .... Take 1 tablet by mouth once a day 4)  Centrum Silver Ultra Mens Tabs (Multiple  vitamins-minerals) .... Take 1 tablet by mouth once a day  Other Orders: Est. Patient Level II (62703) DME Referral (DME)  Patient Instructions: 1)  will try increasing ramp time at current pressure.  If not able to tolerate, will decrease the pressure if you let me know by phone. 2)  work on weight loss 3)  followup with me in one year.   Immunization History:  Influenza Immunization History:    Influenza:  historical (02/23/2009)  Pneumovax Immunization History:    Pneumovax:  historical (02/23/2009)

## 2010-06-24 NOTE — Progress Notes (Signed)
  Walk in Patient Form Recieved " Pt needs Surgical Clearance" sent to Message Nurse [facility]  Oct 13, 2009 10:21 AM

## 2010-06-24 NOTE — Letter (Signed)
Summary: Moviprep Instructions  [facility]  [address]   [place], [place] [address]   Phone: [phone]  Fax: [phone]       Alexander Mack    1960-11-09    MRN: [id number]        Procedure Day Dorna Bloom: Wednesday, 03-31-10     Arrival Time: 7:30 a.m.     Procedure Time: 8:30 a.m.     Location of Procedure:                    x   [facility] Center (4th Floor)   PREPARATION FOR COLONOSCOPY WITH MOVIPREP   Starting 5 days prior to your procedure 03-26-10 do not eat nuts, seeds, popcorn, corn, beans, peas,  salads, or any raw vegetables.  Do not take any fiber supplements (e.g. Metamucil, Citrucel, and Benefiber).  THE DAY BEFORE YOUR PROCEDURE         DATE: 03-30-10   DAY: Tuesday  1.  Drink clear liquids the entire day-NO SOLID FOOD  2.  Do not drink anything colored red or purple.  Avoid juices with pulp.  No orange juice.  3.  Drink at least 64 oz. (8 glasses) of fluid/clear liquids during the day to prevent dehydration and help the prep work efficiently.  CLEAR LIQUIDS INCLUDE: Water Jello Ice Popsicles Tea (sugar ok, no milk/cream) Powdered fruit flavored drinks Coffee (sugar ok, no milk/cream) Gatorade Juice: apple, white grape, white cranberry  Lemonade Clear bullion, consomm, broth Carbonated beverages (any kind) Strained chicken noodle soup Hard Candy                             4.  In the morning, mix first dose of MoviPrep solution:    Empty 1 Pouch A and 1 Pouch B into the disposable container    Add lukewarm drinking water to the top line of the container. Mix to dissolve    Refrigerate (mixed solution should be used within 24 hrs)  5.  Begin drinking the prep at 5:00 p.m. The MoviPrep container is divided by 4 marks.   Every 15 minutes drink the solution down to the next mark (approximately 8 oz) until the full liter is complete.   6.  Follow completed prep with 16 oz of clear liquid of your choice (Nothing red or  purple).  Continue to drink clear liquids until bedtime.  7.  Before going to bed, mix second dose of MoviPrep solution:    Empty 1 Pouch A and 1 Pouch B into the disposable container    Add lukewarm drinking water to the top line of the container. Mix to dissolve    Refrigerate  THE DAY OF YOUR PROCEDURE      DATE:  03-31-10 DAY: Wednesday  Beginning at 3:30 a.m. (5 hours before procedure):         1. Every 15 minutes, drink the solution down to the next mark (approx 8 oz) until the full liter is complete.  2. Follow completed prep with 16 oz. of clear liquid of your choice.    3. You may drink clear liquids until 6:30 a.m.  (2 HOURS BEFORE PROCEDURE).   MEDICATION INSTRUCTIONS  Unless otherwise instructed, you should take regular prescription medications with a small sip of water   as early as possible the morning of your procedure.  Diabetic patients - see separate instructions.    Additional medication instructions: n/a  OTHER INSTRUCTIONS  You will need a responsible adult at least 51 years of age to accompany you and drive you home.   This person must remain in the waiting room during your procedure.  Wear loose fitting clothing that is easily removed.  Leave jewelry and other valuables at home.  However, you may wish to bring a book to read or  an iPod/MP3 player to listen to music as you wait for your procedure to start.  Remove all body piercing jewelry and leave at home.  Total time from sign-in until discharge is approximately 2-3 hours.  You should go home directly after your procedure and rest.  You can resume normal activities the  day after your procedure.  The day of your procedure you should not:   Drive   Make legal decisions   Operate machinery   Drink alcohol   Return to work  You will receive specific instructions about eating, activities and medications before you leave.    The above instructions have been reviewed and  explained to me by   Sherren Kerns RN  March 15, 2010 11:24 AM    I fully understand and can verbalize these instructions _____________________________ Date _________

## 2010-06-24 NOTE — Letter (Signed)
Summary: Diabetic Instructions  [facility]  [address]   [place], [place] [address]   Phone: [phone]  Fax: [phone]    Alexander Mack August 08, 1960 MRN: [id number]   x     ORAL DIABETIC MEDICATION INSTRUCTIONS  The day before your procedure:   Take your diabetic pill as you do normally  The day of your procedure:   Do not take your diabetic pill    We will check your blood sugar levels during the admission process and again in Recovery before discharging you home  ________________________________________________________________________

## 2010-06-24 NOTE — Letter (Signed)
Summary: *Consult Note  [facility]     [place], [place]    Phone:   Fax:     Re:    Alexander Mack DOB:    [birth year]        To Whom It May Concern,  Mr. Kidney is a patient I follow in cardiology clinic.  He has a history of very mild CAD.  I saw him in clinic not that long ago.  He was doing well, without chest pains.   From a cardiac standpoint I think he is at low risk to proceed with surgery.  If you have any questions please call me at [phone].    Sincerely,   Sherrill Raring, MD, [facility]

## 2010-06-24 NOTE — Letter (Signed)
Summary: Patient Notice- Polyp Results  [facility]  [address] [place], [place] [address]   Phone: [phone]  Fax: [phone]        May 14, 2010 MRN: [id number]    LENN VOLKER [address] [place], [place]  [address]    Dear Mr. Roblero,  I am pleased to inform you that the colon polyp(s) removed during your recent colonoscopy was (were) found to be benign (no cancer detected) upon pathologic examination.  I recommend you have a repeat colonoscopy examination in 5_ years to look for recurrent polyps, as having colon polyps increases your risk for having recurrent polyps or even colon cancer in the future.  Should you develop new or worsening symptoms of abdominal pain, bowel habit changes or bleeding from the rectum or bowels, please schedule an evaluation with either your primary care physician or with me.  Additional information/recommendations:  XX__ No further action with gastroenterology is needed at this time. Please      follow-up with your primary care physician for your other healthcare      needs.  __ Please call [phone] to schedule a return visit to review your      situation.  __ Please keep your follow-up visit as already scheduled.  __ Continue treatment plan as outlined the day of your exam.  Please call us if you are having persistent problems or have questions about your condition that have not been fully answered at this time.  Sincerely,  Mardella Layman MD [facility]  This letter has been electronically signed by your physician.  Appended Document: Patient Notice- Polyp Results Letter mailed

## 2010-06-24 NOTE — Progress Notes (Signed)
Summary: Pt calling regarding letter for surgical clearance  Phone Note Call from Patient Call back at Home Phone [phone]   Caller: Patient Summary of Call: PT calling checking letter regarding surgical clearance Initial call taken by: Judie Grieve,  Oct 16, 2009 3:56 PM  Follow-up for Phone Call        [facility] Lisabeth Devoid RN  Additional Follow-up for Phone Call Additional follow up Details #1::        Letter done in emr Additional Follow-up by: Sherrill Raring, MD, [facility],  Oct 19, 2009 10:12 PM     Appended Document: Pt calling regarding letter for surgical clearance Pt. aware that he is ok for eye surgery per Dr. Tenny Craw and note faxed to Dr. Alan Mulder.

## 2010-06-24 NOTE — Letter (Signed)
Summary: Pre Visit Letter Revised  [facility]  [address] [place], [place] [address]   Phone: [phone]  Fax: [phone]        02/15/2010 MRN: [id number] Alexander Mack [address] [place], [place]  [address]             Procedure Date:  03/31/2010   Welcome to the Gastroenterology Division at [facility].    You are scheduled to see a nurse for your pre-procedure visit on 03/15/2010 at 11:00AM on the 3rd floor at [facility], [address]  We ask that you try to arrive at our office 15 minutes prior to your appointment time to allow for check-in.  Please take a minute to review the attached form.  If you answer "Yes" to one or more of the questions on the first page, we ask that you call the person listed at your earliest opportunity.  If you answer "No" to all of the questions, please complete the rest of the form and bring it to your appointment.    Your nurse visit will consist of discussing your medical and surgical history, your immediate family medical history, and your medications.   If you are unable to list all of your medications on the form, please bring the medication bottles to your appointment and we will list them.  We will need to be aware of both prescribed and over the counter drugs.  We will need to know exact dosage information as well.    Please be prepared to read and sign documents such as consent forms, a financial agreement, and acknowledgement forms.  If necessary, and with your consent, a friend or relative is welcome to sit-in on the nurse visit with you.  Please bring your insurance card so that we may make a copy of it.  If your insurance requires a referral to see a specialist, please bring your referral form from your primary care physician.  No co-pay is required for this nurse visit.     If you cannot keep your appointment, please call [phone] to cancel or reschedule prior to your appointment date.  This  allows [place] the opportunity to schedule an appointment for another patient in need of care.    Thank you for choosing [facility] for your medical needs.  We appreciate the opportunity to care for you.  Please visit [place] at our website  to learn more about our practice.  Sincerely, The Gastroenterology Division

## 2010-06-24 NOTE — Procedures (Addendum)
Summary: Colonoscopy  Patient: [name] Note: All result statuses are Final unless otherwise noted.  Tests: (1) Colonoscopy (COL)   COL Colonoscopy           DONE     [facility]     [address]     [place], [place]  [address]           COLONOSCOPY PROCEDURE REPORT           PATIENT:  Alexander Mack, Alexander Mack  MR#:  [id number]     BIRTHDATE:  02-Mar-1961, 49 yrs. old  GENDER:  male     ENDOSCOPIST:  Vania Rea. Jarold Motto, MD, [facility]     REF. BY:     PROCEDURE DATE:  05/12/2010     PROCEDURE:  Average-risk screening colonoscopy     G0121     ASA CLASS:  Class II     INDICATIONS:  Routine Risk Screening     MEDICATIONS:   Fentanyl 50 mcg IV, Versed 5 mg IV           DESCRIPTION OF PROCEDURE:   After the risks benefits and     alternatives of the procedure were thoroughly explained, informed     consent was obtained.  Digital rectal exam was performed and     revealed no abnormalities.   The LB [id number] endoscope     was introduced through the anus and advanced to the cecum, which     was identified by both the appendix and ileocecal valve, without     limitations.  The quality of the prep was excellent, using     MoviPrep.  The instrument was then slowly withdrawn as the colon     was fully examined.     <<PROCEDUREIMAGES>>           FINDINGS:  A sessile polyp was found in the right colon. flat 5 mm     sessile polyp cold snare removed.  This was otherwise a normal     examination of the colon.   Retroflexed views in the rectum     revealed no abnormalities.    The scope was then withdrawn from     the patient and the procedure completed.           COMPLICATIONS:  None     ENDOSCOPIC IMPRESSION:     1) Sessile polyp in the right colon     2) Otherwise normal examination     R/O ADENOMA.     RECOMMENDATIONS:     1) Repeat colonoscopy in 5 years if polyp adenomatous; otherwise     10 years     REPEAT EXAM:  No           ______________________________   Vania Rea. Jarold Motto, MD, [facility]           CC:           n.     eSIGNED:   Vania Rea. Patterson at 05/12/2010 09:49 AM           Bertram Denver, [id number]  Note: An exclamation mark (!) indicates a result that was not dispersed into the flowsheet. Document Creation Date: 05/18/2010 8:50 AM _______________________________________________________________________  (1) Order result status: Final Collection or observation date-time: 05/12/2010 09:39 Requested date-time:  Receipt date-time:  Reported date-time:  Referring Physician:   Ordering Physician: Sheryn Bison [phone]) Specimen Source:  Source: Launa Grill Order Number: [id number] Lab site:

## 2010-06-24 NOTE — Letter (Signed)
Summary: [facility]   [facility]   Imported By: Roderic Ovens 01/26/2010 16:23:26  _____________________________________________________________________  External Attachment:    Type:   Image     Comment:   External Document

## 2010-06-24 NOTE — Miscellaneous (Signed)
Summary: optimal pressure 13cm.  Clinical Lists Changes  Orders: Added new Referral order of DME Referral (DME) - Signed auto download shows optimal pressure 13cm, decent compliance

## 2010-06-24 NOTE — Assessment & Plan Note (Signed)
Summary: yearly/sl   Visit Type:  Follow-up Referring Provider:  Dietrich Pates Primary Provider:  Concepcion Elk  CC:  pt states he may have had a couple of eposides of chest pain since he saw DR Tenny Craw.  History of Present Illness: History of Present Illness: [name] is a 50 year old. He has a history of very mild CAD by catheter in 2007. Myoview scan at that time showed no ischemia. (LAD showed a 30-40% distal narrowing. Circumflex had a 20-30% proximal narrowing. D2 had a 30 to 40% narrowing). I last the patient in July of last year.  Lipids at that time were very good. Since seen he denies chest pains.  Breathing is good.  He has been seen by K. Clance.  Note adjustments on CPAP.  ON talking to the patient today he says he has stopped CPAP, that he doesn't need it now.  Sleeping better.  Apnea about 5%.   Current Medications (verified): 1)  Glucophage 500 Mg Tabs (Metformin Hcl) .... One Tab Two Times A Day 2)  Aspir-Low 81 Mg Tbec (Aspirin) .... Take One (1) Tablet Each Day 3)  Lexapro 20 Mg Tabs (Escitalopram Oxalate) .Marland Kitchen.. 1 Tab Once Daily 4)  Doxycycline Hyclate 100 Mg Caps (Doxycycline Hyclate) .... As Needed 5)  Ibuprofen 800 Mg Tabs (Ibuprofen) .... As Needed 6)  Lipitor 20 Mg Tabs (Atorvastatin Calcium) .... Take One Tablet By Mouth Daily. 7)  Trazodone Hcl 100 Mg Tabs (Trazodone Hcl) .... Take 1 Tab By Mouth At Bedtime 8)  Cilndamycian .... Prn 9)  Cod Liver Oil  Caps (Cod Liver Oil) .... Take 1 Tablet By Mouth Once A Day 10)  Centrum Silver Ultra Mens  Tabs (Multiple Vitamins-Minerals) .... Take 1 Tablet By Mouth Once A Day  Allergies (verified): 1)  ! * Shellfish  Past History:  Past medical, surgical, family and social histories (including risk factors) reviewed, and no changes noted (except as noted below).  Past Medical History: Reviewed history from 12/24/2008 and no changes required.  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  appendectomy   Family History: Reviewed history from 12/24/2008 and no changes required. heart disease- mother diabetic- mother  Social History: Reviewed history from 12/24/2008 and no changes required.  non-smoker, non-drinker, no drug abuse single lives with child children disability    Review of Systems       Claims he is walking daily.  Vital Signs:  Patient profile:   50 year old male Height:      66 inches Weight:      267 pounds BMI:     43.25 Pulse rate:   80 / minute BP sitting:   119 / 71  (left arm) Cuff size:   large  Vitals Entered By: Burnett Kanaris, CNA (November 13, 2009 9:46 AM)  Physical Exam  Additional Exam:  Patient is in NAD HEENT:  Normocephalic, atraumatic. EOMI, PERRLA.  Neck: JVP is normal. No thyromegaly. No bruits.  Lungs: clear to auscultation. No rales no wheezes.  Heart: Regular rate and rhythm. Normal S1, S2. No S3.   No significant murmurs. PMI not displaced.  Abdomen:  Supple, nontender. Normal bowel sounds. No masses. No hepatomegaly.  Extremities:   Good distal pulses throughout. No lower extremity edema.  Musculoskeletal :moving all extremities.  Neuro:   alert and oriented x3.    EKG  Procedure date:  11/13/2009  Findings:      NSR>  83 bpm.  Impression & Recommendations:  Problem # 1:  CAD, NATIVE VESSEL (ICD-414.01) Mild disese by cath.  Continue meds.  Problem # 2:  OBSTRUCTIVE SLEEP APNEA (ICD-327.23) Will send note to K. Clance.  Has f/u in 3 months.  Encouraged exercise and wt loss.   will refer to dietary.  Problem # 3:  HYPERLIPIDEMIA-MIXED (ICD-272.4)  Will set up for fasting lipids and AST. His updated medication list for this problem includes:    Lipitor 20 Mg Tabs (Atorvastatin calcium) .Marland Kitchen... Take one tablet by mouth daily.  Orders: Nutrition Referral (Nutrition)  Problem # 4:  HYPERTENSION, ESSENTIAL NOS (ICD-401.9) Good control. His updated medication list for this problem includes:    Aspir-low 81 Mg Tbec (Aspirin) .Marland Kitchen... Take one (1) tablet each day  Patient Instructions: 1)  F/U 1 year.  Exercise.  Watch calorie intake  Will refer to dietary.  F/U K. Clance.  Get fasting lipids.

## 2010-06-24 NOTE — Letter (Signed)
Summary: [facility] Medical Clearance   [facility] Medical Clearance   Imported By: Roderic Ovens 11/04/2009 11:55:53  _____________________________________________________________________  External Attachment:    Type:   Image     Comment:   External Document

## 2010-06-24 NOTE — Miscellaneous (Signed)
Summary: previsit prep/rm  Clinical Lists Changes  Medications: Added new medication of MOVIPREP 100 GM  SOLR (PEG-KCL-NACL-NASULF-NA ASC-C) As per prep instructions. - Signed Rx of MOVIPREP 100 GM  SOLR (PEG-KCL-NACL-NASULF-NA ASC-C) As per prep instructions.;  #1 x 0;  Signed;  Entered by: Sherren Kerns RN;  Authorized by: Mardella Layman MD [facility];  Method used: [facility] to [facility] [phone]*, [address] [place], [place]  [address], Ph: [phone] or [phone], Fax: [phone] Observations: Added new observation of ALLERGY REV: Done (03/15/2010 10:59)    Prescriptions: MOVIPREP 100 GM  SOLR (PEG-KCL-NACL-NASULF-NA ASC-C) As per prep instructions.  #1 x 0   Entered by:   Sherren Kerns RN   Authorized by:   Mardella Layman MD [facility]   Signed by:   Sherren Kerns RN on 03/15/2010   Method used:   [facility]ically to        [facility] [phone]* (retail)       [address]       [place], [place]  [address]       Ph: [phone] or [phone]       Fax: [phone]   RxID:   [id number]

## 2010-06-30 NOTE — Progress Notes (Signed)
Summary: nos appt  Phone Note Call from Patient   Caller: [email or web address]  Call For: clance Summary of Call: LMTCB x2 to rsc nos from 2/1. Initial call taken by: Darletta Moll,  June 24, 2010 3:05 PM

## 2010-07-12 NOTE — Procedures (Signed)
Summary: Colonoscopy  Patient: Alexander Mack Note: All result statuses are Final unless otherwise noted.  Tests: (1) Colonoscopy (COL)   COL Colonoscopy           DONE     [facility]     [address]     [place], [place]  [address]           COLONOSCOPY PROCEDURE REPORT           PATIENT:  [name]  MR#:  [id number]     BIRTHDATE:  11/05/1953, 56 yrs. old  GENDER:  male     ENDOSCOPIST:  Vania Rea. Jarold Motto, MD, [facility]     REF. BY:     PROCEDURE DATE:  05/12/2010     PROCEDURE:  Average-risk screening colonoscopy     G0121     ASA CLASS:  Class II     INDICATIONS:  Routine Risk Screening prior incomplete exam.     MEDICATIONS:   Fentanyl 75 mcg IV, Versed 7 mg IV           DESCRIPTION OF PROCEDURE:   After the risks benefits and     alternatives of the procedure were thoroughly explained, informed     consent was obtained.  Digital rectal exam was performed and     revealed no abnormalities.   The LB 180AL [id number] endoscope was     introduced through the anus and advanced to the cecum, which was     identified by both the appendix and ileocecal valve, limited by     poor preparation, a redundant colon.    The quality of the prep     was poor, using MoviPrep.  The instrument was then slowly     withdrawn as the colon was fully examined.     <<PROCEDUREIMAGES>>           FINDINGS:  No polyps or cancers were seen.  This was otherwise a     normal examination of the colon.   Retroflexed views in the rectum     revealed no abnormalities.    The scope was then withdrawn from     the patient and the procedure completed.           COMPLICATIONS:  None     ENDOSCOPIC IMPRESSION:     1) No polyps or cancers     2) Otherwise normal examination     RECOMMENDATIONS:     1) Repeat Colonoscopy in 5 years.     REPEAT EXAM:  No           ______________________________     Vania Rea. Jarold Motto, MD, [facility]           CC:  Minus Breeding, MD           n.  Rosalie DoctorMarland Kitchen   Vania Rea. Patterson at 05/12/2010 09:19 AM           [name], [id number]  Note: An exclamation mark (!) indicates a result that was not dispersed into the flowsheet. Document Creation Date: 05/18/2010 8:50 AM _______________________________________________________________________  (1) Order result status: Final Collection or observation date-time: 05/12/2010 09:08 Requested date-time:  Receipt date-time:  Reported date-time:  Referring Physician:   Ordering Physician: Sheryn Bison [phone]) Specimen Source:  Source: Launa Grill Order Number: [id number] Lab site:

## 2010-07-23 ENCOUNTER — Ambulatory Visit (INDEPENDENT_AMBULATORY_CARE_PROVIDER_SITE_OTHER): Payer: Medicare Other | Admitting: Pulmonary Disease

## 2010-07-23 ENCOUNTER — Encounter: Payer: Self-pay | Admitting: Pulmonary Disease

## 2010-07-23 DIAGNOSIS — G4733 Obstructive sleep apnea (adult) (pediatric): Secondary | ICD-10-CM

## 2010-08-02 LAB — GLUCOSE, CAPILLARY
Glucose-Capillary: 181 mg/dL — ABNORMAL HIGH (ref 70–99)
Glucose-Capillary: 95 mg/dL (ref 70–99)

## 2010-08-03 NOTE — Assessment & Plan Note (Signed)
Summary: rov for osa   Copy to:  Dietrich Pates Primary Provider/Referring Provider:  Concepcion Elk  CC:  1 year f/u appt for OSA.  Pt states he is wearing his cpap machine only 3 nights per week.  Approx 8 hours per night.  Pt denid any complaints with mask or pressure.  Marland Kitchen  History of Present Illness: the pt comes in today for f/u of his known mild osa.  He is wearing cpap 3nights a week, and feels this is adequate for him.  He has no issues with mask fit or pressure tolerance, and feels it is helping his sleep and daytime alertness.    Medications Prior to Update: 1)  Glucophage 500 Mg Tabs (Metformin Hcl) .... One Tab Two Times A Day 2)  Aspir-Low 81 Mg Tbec (Aspirin) .... Take One (1) Tablet Each Day 3)  Lexapro 20 Mg Tabs (Escitalopram Oxalate) .Marland Kitchen.. 1 Tab Once Daily 4)  Doxycycline Hyclate 100 Mg Caps (Doxycycline Hyclate) .... As Needed 5)  Ibuprofen 800 Mg Tabs (Ibuprofen) .... As Needed 6)  Lipitor 20 Mg Tabs (Atorvastatin Calcium) .... Take One Tablet By Mouth Daily. 7)  Trazodone Hcl 100 Mg Tabs (Trazodone Hcl) .... Take 1 Tab By Mouth At Bedtime 8)  Cilndamycian .... Prn 9)  Cod Liver Oil  Caps (Cod Liver Oil) .... Take 1 Tablet By Mouth Once A Day 10)  Centrum Silver Ultra Mens  Tabs (Multiple Vitamins-Minerals) .... Take 1 Tablet By Mouth Once A Day  Allergies (verified): 1)  ! * Shellfish  Review of Systems       The patient complains of non-productive cough, weight change, anxiety, depression, hand/feet swelling, and joint stiffness or pain.  The patient denies shortness of breath with activity, shortness of breath at rest, productive cough, coughing up blood, chest pain, irregular heartbeats, acid heartburn, indigestion, loss of appetite, abdominal pain, difficulty swallowing, sore throat, tooth/dental problems, headaches, nasal congestion/difficulty breathing through nose, sneezing, itching, ear ache, rash, change in color of mucus, and fever.    Vital Signs:  Patient profile:    50 year old male Height:      66 inches Weight:      284.25 pounds BMI:     46.04 O2 Sat:      95 % on Room air Temp:     98.5 degrees F oral Pulse rate:   86 / minute BP sitting:   108 / 84  (left arm) Cuff size:   large  Vitals Entered By: Arman Filter LPN (July 23, 5407 9:50 AM)  O2 Flow:  Room air CC: 1 year f/u appt for OSA.  Pt states he is wearing his cpap machine only 3 nights per week.  Approx 8 hours per night.  Pt denid any complaints with mask or pressure.   Comments Medications reviewed with patient Arman Filter LPN  July 22, 8117 9:50 AM    Physical Exam  General:  obese male in nad  Nose:  no skin breakdown or pressure necrosis from cpap mask  Extremities:  no edema or cyanosis  Neurologic:  alert, not sleepy, moves all 4    Impression & Recommendations:  Problem # 1:  OBSTRUCTIVE SLEEP APNEA (ICD-327.23)  the pt is wearing cpap 3-4 nights/week.  He had very mild osa, so this is really not a medical issue for him.  He can wear the cpap as much as he feels he needs to maintain QOL.  I also stressed to him the importance  of weight loss as well, and that his sleep apnea can worsen if he gains weight.   Other Orders: Est. Patient Level III (14782)  Patient Instructions: 1)  work on weight loss 2)  wear cpap as much as you can if it helps your sleep 3)  keep up with supplles and mask changes 4)  followup with me in one year.

## 2010-08-09 LAB — COMPREHENSIVE METABOLIC PANEL
AST: 25 U/L (ref 0–37)
CO2: 29 mEq/L (ref 19–32)
Calcium: 9.4 mg/dL (ref 8.4–10.5)
Creatinine, Ser: 1 mg/dL (ref 0.4–1.5)
GFR calc Af Amer: >60 mL/min (ref >60)
GFR calc non Af Amer: >60 mL/min (ref >60)
Glucose, Bld: 137 mg/dL — ABNORMAL HIGH (ref 70–99)

## 2010-08-09 LAB — CBC
MCHC: 33.2 g/dL (ref 30.0–36.0)
MCV: 83.8 fL (ref 78.0–100.0)
RBC: 4.41 MIL/uL (ref 4.22–5.81)
RDW: 14.8 % (ref 11.5–15.5)

## 2010-08-09 LAB — POCT I-STAT, CHEM 8
BUN: 12 mg/dL (ref 6–23)
Creatinine, Ser: 0.9 mg/dL (ref 0.4–1.5)
Glucose, Bld: 93 mg/dL (ref 70–99)
Sodium: 140 mEq/L (ref 135–145)
TCO2: 28 mmol/L (ref 0–100)

## 2010-08-09 LAB — URINALYSIS, ROUTINE W REFLEX MICROSCOPIC
Bilirubin Urine: NEGATIVE
Glucose, UA: >1000 mg/dL — AB
Hgb urine dipstick: NEGATIVE
Specific Gravity, Urine: 1.028 (ref 1.005–1.030)

## 2010-08-09 LAB — URINE MICROSCOPIC-ADD ON

## 2010-08-09 LAB — POCT CARDIAC MARKERS

## 2010-08-09 LAB — GLUCOSE, CAPILLARY: Glucose-Capillary: 99 mg/dL (ref 70–99)

## 2010-08-09 LAB — PROTIME-INR: Prothrombin Time: 13.5 seconds (ref 11.6–15.2)

## 2010-08-25 LAB — BASIC METABOLIC PANEL
Chloride: 105 mEq/L (ref 96–112)
GFR calc Af Amer: >60 mL/min (ref >60)
Potassium: 4.5 mEq/L (ref 3.5–5.1)
Sodium: 141 mEq/L (ref 135–145)

## 2010-08-25 LAB — HEPATIC FUNCTION PANEL
ALT: 35 U/L (ref 0–53)
AST: 30 U/L (ref 0–37)
Albumin: 3.7 g/dL (ref 3.5–5.2)
Alkaline Phosphatase: 89 U/L (ref 39–117)
Total Protein: 7.5 g/dL (ref 6.0–8.3)

## 2010-08-25 LAB — HEMOGLOBIN A1C: Hgb A1c MFr Bld: 6.4 % — ABNORMAL HIGH (ref 4.6–6.1)

## 2010-08-29 LAB — URINALYSIS, ROUTINE W REFLEX MICROSCOPIC
Glucose, UA: NEGATIVE mg/dL
Hgb urine dipstick: NEGATIVE
Specific Gravity, Urine: 1.024 (ref 1.005–1.030)
Urobilinogen, UA: 1 mg/dL (ref 0.0–1.0)
pH: 5.5 (ref 5.0–8.0)

## 2010-10-05 NOTE — Procedures (Signed)
NAME:  Alexander Mack, Alexander Mack NO.:  [id number]   MEDICAL RECORD NO.:  [id number]          PATIENT TYPE:  OUT   LOCATION:  SLEEP CENTER                 FACILITY[facility]  [facility]   PHYSICIAN:  Barbaraann Share, MD,FCCPDATE OF BIRTH:  04-Mar-1960   DATE OF STUDY:  11/12/2008                            NOCTURNAL POLYSOMNOGRAM   REFERRING PHYSICIAN:   LOCATION:  Sleep Lab.   REFERRING PHYSICIAN:  Pricilla Riffle, MD, [facility]   INDICATIONS FOR THE STUDY:  Hypersomnia with sleep apnea.   EPWORTH SCORE:  Three.   SLEEP ARCHITECTURE:  The patient had a total sleep time of 334 minutes  with no slow wave sleep and only 32 minutes of REM.  Sleep onset latency  was prolonged at 58 minutes, and REM onset was prolonged at 135 minutes.  Sleep efficiency was poor at 68%.   RESPIRATORY DATA:  The patient was found to have 15 apneas and 11  obstructive hypopneas, giving him an AHI of only 5 events per hour.  Events were not positional.  There was very loud snoring noted  throughout.  The patient did not meet split night protocol secondary to  the small numbers of events by 2 a.m.  It should be noted, however, the  patient had very little REM during the entire night.   OXYGEN DATA:  There was O2 desaturation as low as 81% with the patient's  obstructive events.   CARDIAC DATA:  No clinically significant arrhythmias were seen.   MOVEMENT/PARASOMNIA:  There were no significant leg jerks or abnormal  behavior seen.   IMPRESSION/RECOMMENDATIONS:  Very mild obstructive sleep apnea/hypopnea  syndrome with an AHI of 5 events per hour and O2 desaturation as low as  81%.  Split night criteria was not met secondary to the small numbers of  events.  The patient had very little REM during the entire night, and  therefore his degree of sleep apnea may be underestimated.  Treatment  for this degree of sleep apnea should depend upon the degree it is  impacting his quality  of life.  This degree of sleep  apnea has very little impact on  cardiovascular health.  Treatment options include a trial of weight loss  alone, upper airway surgery, dental appliance, and also CPAP.      Barbaraann Share, MD,FCCP  Diplomate, American Board of Sleep  Medicine  Electronically Signed     K[facility]/MEDQ  D:  11/23/2008 14:21:59  T:  11/24/2008 02:46:34  Job:  [id number]

## 2010-10-05 NOTE — Assessment & Plan Note (Signed)
[facility]                            CARDIOLOGY OFFICE NOTE   BOLESLAUS, HOLLOWAY                    MRN:          [id number]  DATE:02/15/2008                            DOB:          1961/04/03    IDENTIFICATION:  Alexander Mack is a gentleman who was seen remotely in  clinic by Charlton Haws.  He has had a Myoview in the past.  Cardiac  catheterization 2007, that showed mild CAD.   The patient comes in for evaluation of chest pain.  He was referred by  his primary a week ago.  He was placed on glyburide and metformin  continued, after starting this the day after began developing chest  pressure.  He went to the emergency room on Saturday.  Medications were  stopped.  Since that time he has noted no episodes of chest pressure.   Otherwise his activities are unchanged.  He goes to that gym, no change  in his ability to do things.   He does complain of some right side discomfort.  He does have lower back  problems with slip disc.  No claudication type symptoms in his cath.  His current medicines include metformin 850 daily, Lantus insulin,  doxycycline, and Lipitor 20.   PHYSICAL EXAMINATION:  GENERAL:  The patient is in no distress at rest.  VITAL SIGNS:  Blood pressure is 134/76, pulse is 83 and regular, weight  279 down from 286 in 2007.  LUNGS:  Clear.  CARDIAC:  Regular rate and rhythm, S1, S2, no S3, no significant  murmurs.  ABDOMEN:  Benign.  EXTREMITIES:  No edema and good distal pulses.   A 12-lead EKG, normal sinus rhythm, 84 beats per minute   IMPRESSION:  1. Chest pain.  I am not convinced that this is an anginal equivalent.      It is too coincidental with the timing of his medicines and is now      resolved.  I would keep him on the same regimen for now.  We will      set followup in a few months' time unless his symptoms come back.  2. Health care maintenance.  Will need to have fasting lipid panel      done, AST since he  is on Lipitor.     Pricilla Riffle, MD, [facility]  Electronically Signed    PVR/MedQ  DD: 02/18/2008  DT: 02/19/2008  Job #: [id number]

## 2010-10-05 NOTE — H&P (Signed)
NAME:  DERREL, MOORE NO.:  [id number]   MEDICAL RECORD NO.:  [id number]          PATIENT TYPE:  OBV   LOCATION:  1431                         FACILITY:  [facility]   PHYSICIAN:  Lonia Blood, M.D.       DATE OF BIRTH:  [birth year]   DATE OF ADMISSION:  12/02/2006  DATE OF DISCHARGE:  12/03/2006                              HISTORY & PHYSICAL   PRIMARY CARE PHYSICIAN:  [facility].   CHIEF COMPLAINTS:  Chest pain.   HISTORY OF PRESENT ILLNESS:  Mr. Mcintire is a 50 year old gentleman  with his hyperlipidema, diabetes, hypertension.  Tension who presented  to [facility] emergency room, after he had an episode of left-sided  chest pain with numbness of the face and numbness of the left upper  extremity.  The patient had a similar episode in November 2007, at which  time he had a cardiac catheterization, revealing a 40% LAD stenosis.  The patient was told to take aspirin on a daily basis, but he has not  been doing that.  The patient denies any cocaine use.  The patient  reports that he does not smoke cigarettes.  He currently reports that  chest pain has resolved.   HOME MEDICATIONS:  Celexa, metformin, pravastatin.   PAST MEDICAL HISTORY:  Diabetes, hypertension, hyperlipidemia, gout,  osteoarthritis and appendectomy.   FAMILY HISTORY:  Positive for diabetes.   SOCIAL HISTORY:  The patient is single, has three children.  He is on  disability.  Denies any alcohol, tobacco or drugs.   ALLERGIES:  The patient is allergic to Fluorescein.   REVIEW OF SYSTEMS:  As per HPI.  All other systems are negative.   PHYSICAL EXAM:  VITAL SIGNS:  Upon admission, shows a temperature of  97.4, blood pressure 103/68, pulse 78, respiration 18, saturation 96% on  room air.  GENERAL APPEARANCE:  He is alert gentleman in no acute distress, sitting  on a stretcher, alert, oriented to place, person and time.  HEENT:  Head normocephalic, atraumatic.  Eyes: Pupils equal,  round react  to light and accommodation.  Extraocular movements  intact.  Throat is  clear.  NECK:  Supple.  No JVD.  No carotid bruits.  CHEST:  Clear to auscultation without wheezing or crackles.  HEART:  Regular rate and rhythm without murmurs, rubs or gallops.  ABDOMEN:  The patient's abdomen is soft, nontender, nondistended.  Bowel  sounds are present.  LOWER EXTREMITIES: Without edema.  SKIN:  Warm, dry.  There is some old scars on the left upper extremity.   LABORATORY VALUES:  Sodium 140, potassium 3.7, chloride 105, bicarb 29,  BUN 7, creatinine 0.9, calcium 9.4, myoglobin 57, white blood cells 7.9,  hemoglobin 12.8, hematocrit 37.8.  Platelet count is 261, myoglobin 62,  CK-MB 1.1, troponin-I less than 0.05.  EKG shows normal sinus rhythm  without any ST-T changes.   ASSESSMENT/PLAN:  1. Chest pain.  This 50 year old gentleman with hx of single-vessel      coronary artery disease could have had an episode of angina.  He      does not take  aspirin on a daily basis, and he is not on a nitrate.      This medications could well be started while he is here in the      hospital, ruling out for myocardial infarction.  Risk factors need      to be addressed again, and education is to be provided.  Also,      proton pump inhibitor for acid reduction should be offered as well.      Lonia Blood, M.D.  Electronically Signed     SL/MEDQ  D:  12/02/2006  T:  12/03/2006  Job:  [id number]

## 2010-10-05 NOTE — Assessment & Plan Note (Signed)
[facility]                            CARDIOLOGY OFFICE NOTE   Alexander Mack, Alexander Mack                    MRN:          [id number]  DATE:04/21/2008                            DOB:          12-07-1960    IDENTIFICATION:  Alexander Mack is a 50 year old gentleman, who I saw  back in September.  He has a history of very mild CAD by cath in  November 2007.  Myoview also at that time without ischemia.  When the  patient was seen in September, he had been also seen in the emergency  room with chest pain.  On talking to him, I felt that the chest pain was  not cardiac, was related to the timing of his medicines.   The patient called back a few weeks ago, noting continued chest  tightness just while sitting in a chair.  I recommended a trial of  Protonix 40 with followup in a few weeks.   On talking to the patient, he states the Protonix may have helped some  though the pain is still there.  It occurs about 2 times per week with  and without activity.  He denies any dysphagia.  He denies any brackish  taste in his mouth.   CURRENT MEDICATIONS:  1. Aspirin 81.  2. Doxycycline 100.  3. Metformin 500.  4. Glimepiride 4.  5. Lipitor 20.  6. Protonix 40.   PHYSICAL EXAMINATION:  GENERAL:  The patient is in no distress at rest.  VITAL SIGNS:  Blood pressure is 122/82, pulse is 70 and regular, and  weight is 276 pounds.  NECK:  JVP is normal.  LUNGS:  Clear.  No rales.  CARDIAC:  Regular rate and rhythm.  S1 and S2.  No S3.  No significant  murmur.  ABDOMEN:  Benign.  EXTREMITIES:  No edema.  Good pulses.   A 12-lead EKG shows normal sinus rhythm, 70 beats per minute.   IMPRESSION:  1. Chest pain.  Again, I do not think it is cardiac.  It seems that it      may be gastrointestinal with a partial response to Protonix.  I      think though with him being on it since early November and still      having symptoms, I would recommend referral to GI as the  patient      may need endoscopy.  2. Coronary artery disease, mild again by cath.  I would keep him on      aspirin as he has.  3. Dyslipidemia.  The patient seen in the [facility].  We will send      a fax for getting the lipids drawn there.  4. Health care maintenance.  Counseled on activity and weight loss.   I will set to see the patient back otherwise in 6 months, sooner if  problems develop and again await to hear from GI.     Pricilla Riffle, MD, [facility]  Electronically Signed    PVR/MedQ  DD: 04/21/2008  DT: 04/22/2008  Job #: [id number]   cc:   Fleet Contras, M.D.

## 2010-10-05 NOTE — Discharge Summary (Signed)
NAME:  Alexander Mack, Alexander Mack NO.:  [id number]   MEDICAL RECORD NO.:  [id number]          PATIENT TYPE:  OBV   LOCATION:  1431                         FACILITY:  [facility]   PHYSICIAN:  Madaline Savage, MD        DATE OF BIRTH:  [birth year]   DATE OF ADMISSION:  12/01/2006  DATE OF DISCHARGE:  12/03/2006                               DISCHARGE SUMMARY   PRIMARY CARE PHYSICIAN:  [facility].  This patient is unassigned to [place].   DISCHARGE DIAGNOSES:  1. Atypical chest pain ruled out acute coronary syndrome.  2. History of coronary artery disease with 40% left anterior      descending lesion.  3. Hyperlipidemia.  4. Spontaneous bleed from the left ear, which has resolved.  5. Obesity.  6. Diabetes mellitus type 2.  7. Depression.  8. Gout.  9. Chronic back pain.   DISCHARGE MEDICATIONS:  1. Aspirin 81 mg once daily.  2. Protonix 40 mg once daily.  3. Nitroglycerin 0.4 mg sublingual x3 three minutes apart as needed.  4. Ciprodex ear drops to left ear 4 drops twice daily for 1 week.  5. Celexa 20 mg once daily.  6. Pravachol 40 mg once daily.  7. Metformin 850 mg twice daily.  8. Lexapro 20 mg daily.   HISTORY OF PRESENT ILLNESS:  For full history and physical, see the  history and physical dictated by Dr. Lavera Guise on December 02, 2006.  In short,  Alexander Mack is a 50 year old gentleman with a history of coronary  artery disease who had a recent cath in November 2007, which showed 40%  LAD lesion, comes in with an atypical chest pain.  He described the  chest pain as a tightness and was admitted for further observation and  ruling out an acute coronary syndrome.   PROBLEM LIST:  PROBLEM #1 - CHEST PAIN:  Alexander Mack had a cardiac  cath in November 2007, which showed only a 40% LAD lesion and he was  observed.  He had three sets of cardiac markers was which were all  negative.  He had two EKGs which were all negative.  At this time his  chest pain has completely resolved.  We  have treated him with a proton  pump inhibitor in the hospital.  I will continue him on the aspirin as  an outpatient.  I will give him prescription for sublingual  nitroglycerin to be taken as needed.  Will also continue the proton pump  inhibitor as an outpatient.   PROBLEM #2 - SPONTANEOUS BLEED OF HIS LEFT EAR:  On the day of discharge  he develops spontaneous bleed from his left ear.  He denies picking his  ear.  He denies having any discharge or any pain.  He was on Lovenox at  70 mg subcu, which is the dose for DVT prophylaxis for his weight.  On  examination, there was clotted blood.  The active bleeding had stopped.  I have called be in the ENT doctor on call, Dr. Suszanne Conners, who recommended to  start him on an antibiotic ear drop.  He will  see him as an outpatient  in his office in a few days.   DISPOSITION:  He will now be discharged back home in stable condition.   FOLLOW UP:  He is asked to follow up with [facility], he has an  appointment in two weeks.  He is also asked to follow-up with Dr. Suszanne Conners.  He is asked to call his number [phone] and see him in one week.      Madaline Savage, MD  Electronically Signed     PKN/MEDQ  D:  12/03/2006  T:  12/04/2006  Job:  [id number]

## 2010-10-08 NOTE — Discharge Summary (Signed)
NAME:  Alexander Mack, Alexander Mack           ACCOUNT NO.:  [id number]   MEDICAL RECORD NO.:  [id number]          PATIENT TYPE:  INP   LOCATION:  3729                         FACILITY:  [facility]   PHYSICIAN:  Corinna L. Lendell Caprice, MDDATE OF BIRTH:  Feb 25, 1961   DATE OF ADMISSION:  03/13/2005  DATE OF DISCHARGE:  03/14/2005                                 DISCHARGE SUMMARY   CHIEF COMPLAINTS:  Discharge Diagnoses  1.  Chest pain.  2.  Left-sided numbness.  3.  Untreated diabetes.  4.  Obesity.  5.  Cocaine abuse.   DISCHARGE MEDICATIONS:  1.  Metformin 500 mg p.o. b.i.d.  2.  Aspirin 81 mg p.o. daily.   ACTIVITY:  Ad lib.   FOLLOW UP:  [facility].   CONDITION:  Stable.   DIET:  Diabetic diet.   He is encouraged to avoid illicit drug use and attend Narcotics Anonymous  meetings if need be.   CONSULTATIONS:  None.   PROCEDURES:  None.   PERTINENT LABORATORY DATA:  Serial cardiac enzymes were negative. CBC  unremarkable. Coagulation panel normal. Complete metabolic panel essentially  normal. Urine drug screen positive for cocaine.   SPECIAL STUDIES AND RADIOLOGY:  MRI of the brain showed no infarct or bleed.  MRA was also negative. CT brain negative.  The chest x-ray showed stable  mild peribronchial thickening, nothing acute. EKG showed normal sinus rhythm  with mild intraventricular conduction delay.   HISTORY AND HOSPITAL COURSE:  Mr. Jaso is a 50 year old black male  diabetic who was unassigned.  He had been going to [facility] but has not  seen them for over half a year. He is not on any medications. He presented  with left-sided chest pain which was atypical. He had radiation to his arm,  face and leg. He admitted to previous history of cocaine use but denied  current use. Please see H&P for complete details.  A urine drug screen was  positive for cocaine. MRI,  MRA and cardiac enzymes were negative. I suspect  the patient has vasospasm which caused his symptoms  and I have encouraged  him to quit abusing drugs and to be compliant with his  medications. I have arranged indigent medications for him and requested that  he follow up at [facility].  He had no further symptoms on the day  of discharge and had normal vital signs. He had remained in normal sinus  rhythm on telemetry. His echocardiogram and further laboratory studies were  cancelled.      Corinna L. Lendell Caprice, MD  Electronically Signed     CLS/MEDQ  D:  03/14/2005  T:  03/14/2005  Job:  [id number]

## 2010-10-08 NOTE — Assessment & Plan Note (Signed)
[facility]                            CARDIOLOGY OFFICE NOTE   Alexander Mack, Alexander Mack                    MRN:          [id number]  DATE:04/17/2006                            DOB:          Apr 03, 1961    HISTORY OF PRESENT ILLNESS:  Alexander Mack returns today for followup. I  saw him in the hospital as a consultation. He was a somewhat concerning,  morbidly obese, diabetic, with chest pain. His Myoview suggested  coronary disease, however, he had a heart catheterization by Dr.  Riley Kill, which showed no critical coronary artery disease and normal LV  function.   Since his discharge, he continues to have problems with paresthesias in  the left arm and extending up into the left side of his face. He also  occasionally gets headaches. Clinically, I cannot differentiate between  possible cluster headaches or possible mild sensory CVA.   I think that the patient should be referred to neurology and have a  followup head CT. He continues to have chest pain, however, I doubt that  it is angina after looking at his catheterization films. He has no  significant coronary disease.   The patient is being followed by [facility] for his diabetes and  continues to be on Glucophage in terms of risk factor modification. He  is also on Pravastatin 40 a day and is taking a baby aspirin a day.   REVIEW OF SYSTEMS:  Remarkable for primarily the left sided pain and  paresthesias on the face and arm. He has not had any problems with his  speech. He has not had any significant PND or orthopnea. He has some  chronic lower extremity edema.   PHYSICAL EXAMINATION:  VITAL SIGNS:  Blood pressure 150/80, pulse 71 and  regular.  GENERAL:  He is morbidly obese.  HEENT:  Normal. There is no lymphadenopathy.  NECK:  No carotid bruits.  LUNGS:  Clear.  HEART:  S1 and S2 with distant heart sounds.  ABDOMEN:  Benign. He has stria over the anterior surface of the abdomen.  EXTREMITIES:  Distal pulses intact with trace edema.   LABORATORY DATA:  EKG is totally normal.   IMPRESSION/RECOMMENDATIONS:  1. Chest pain, continued, probable not cardiac in etiology. Recent      catheterization with no coronary disease and totally normal EKG.  2. Continued paresthesias in the left arm and face, possibly related      to neuralgia from diabetes, versus sensory transient ischemic      attack, versus cervical spine disease. The patient will have a head      CT and should followup with neurology as well as [facility].      From a cardiac perspective, he is currently stable and I do not      think he needs further followup.     Noralyn Pick. Eden Emms, MD, [facility]  Electronically Signed    PCN/MedQ  DD: 04/17/2006  DT: 04/17/2006  Job #: [id number]

## 2010-10-08 NOTE — Consult Note (Signed)
NAME:  LEANORD, THIBEAU NO.:  [id number]   MEDICAL RECORD NO.:  [id number]          PATIENT TYPE:  INP   LOCATION:  4707                         FACILITY:  [facility]   PHYSICIAN:  Noralyn Pick. Eden Emms, MD, FACCDATE OF BIRTH:  1960-12-03   DATE OF CONSULTATION:  DATE OF DISCHARGE:                                   CONSULTATION   PRIMARY CARE PHYSICIAN:  Dr. Mikeal Hawthorne with [facility].   DATE OF BIRTH:  Sep 07, 1960   DATE OF CONSULTATION:  April 03, 2006   HISTORY OF PRESENT ILLNESS:  This 50 year old obese African-American male  who was awakened on April 02, 2006, early morning hours while sleeping,  secondary to chest tightness and associated with left arm numbness.  The  patient states it lasted about 15 minutes on an 8/10 scale.  He had no  associated dyspnea, diaphoresis, nausea, or vomiting associated with it.  The patient sat up in the bed and it went away on its own.  The patient went  back to sleep and awoke again after unknown period of time with the same  type of pain, 8/10, with associated numbness and tingling in the left arm  and fingers.  The patient did not go back to sleep, but waited until around  7 p.m. Saturday evening and came to the emergency room.  He was seen by  [facility] physicians and admitted to rule out cardiac etiology of  chest discomfort.   The patient was subsequently scheduled for an adenosine Myoview study on  April 03, 2006, which was completed and with results per Dr. Loris Bing.  Troponins were found to be negative at 0.01, 0.01, and 0.02  respectively.  Myoview stress test revealed abnormal with anterolateral MI  and periinfarct ischemia.  Secondary to these test results, we were  consulted for further evaluation from a cardiovascular standpoint.   PAST MEDICAL HISTORY:  1. Non-insulin dependent diabetes x2 years.  2. Hypertension (no medications x10 years).  3. Disk prolapse.  4. Hypercholesterolemia.  5. Gout.  6. Chronic back and joint problems.  7. Obesity.  8. History of cocaine use, but quit in 2006.   SOCIAL:  The patient lives in [place] with his sister.  He is unemployed.  He is unmarried.  He has 1 child.  He has stopped smoking x20 years.  Prior  history of heavy alcohol use, but stopped in 2006.  Prior history of cocaine  abuse, but stopped in 2006.  The patient is a prior weight lifter.   FAMILY HISTORY:  Mother died of diabetes and hypertension.  Father died of  unknown causes.  Sister in good health.   ALLERGIES:  NO KNOWN DRUG ALLERGIES.   CURRENT MEDICATIONS:  1. Lovenox 40 mg 1 p.o. q. day.  2. Protonix 40 mg p.o. q. day.  3. Zocor 40 mg p.o. nightly.  4. Tylenol 325 mg q.i.d. p.r.n.  5. Naproxen sodium 500 mg q. day and p.r.n.  6. Robaxin 750 mg q.6 hours and p.r.n.   PHYSICAL EXAMINATION:  VITAL SIGNS:  Blood pressure 122/77, pulse 77,  respirations 20, temperature 97.2, weight 284 pounds.  O2 saturation 99% on  room air.  GENERAL:  Obese African-American male in no acute distress.  HEENT:  Head is normocephalic and atraumatic.  Eyes PERRLA.  Mucous  membranes mouth pink and moist.  Tongue is in the midline.  NECK:  Supple without JVD or carotid bruits appreciated.  CARDIOVASCULAR:  Regular rate and rhythm without murmurs, rubs, or gallops.  No JVD.  No carotid bruits are appreciated.  Radial pulses 1+ bilaterally.  Dorsalis pedis 1+ bilaterally.  LUNGS:  Clear to auscultation without crackles, wheezes, or rhonchi.  ABDOMEN:  Obese, nontender, with 2+ bowel sounds.  EXTREMITIES:  Without clubbing, cyanosis, or edema.  SKIN:  Warm and dry.  NEUROLOGICAL:  Grossly intact.   Chest x-ray reveals stable cardiomyopathy with worsening pulmonary vascular  congestion.  EKG reveals normal sinus rhythm, ventricular rate of 83 beats  per minute without evidence of ischemia seen.   CURRENT LABS:  Hemoglobin 13.5, hematocrit 40.5, white blood cells 6.6,   platelets 276, sodium 140, potassium 3.7, chloride 105, BUN 12, creatinine  1.0, glucose 132.  D-dimer negative at 0.24.  Cardiac biomarkers were  negative x3.   IMPRESSION:  1. Chest pain.  2. Abnormal Myoview stress test dated April 03, 2006, revealing      anterolateral myocardial infarction with periinfarct ischemia.  3. Non-insulin dependent diabetes.  4. Hypertension.  5. Hypercholesterolemia.  6. Obesity.  7. History of polysubstance abuse, none x3 years.   PLAN:  This is a 50 year old obese African-American male with chest pain  which awakened him twice during the night on day of admission with  subsequent abnormal Myoview stress test.  The patient has been seen by Dr.  Charlton Haws.  Plans for cardiac catheterization for April 04, 2006, have  been instituted.  Dr. Eden Emms did discuss with the patient risks and benefits  of the procedure and he is willing to proceed.  The patient will be kept  n.p.o. after  midnight, placed on sliding scale insulin coverage.  Other medications will  continue without cessation, with exception of the Lovenox in the a.m. prior  to cath.  Will make further cardiac recommendations based upon cardiac  catheterization report.      Bettey Mare. Lyman Bishop, NP      Noralyn Pick. Eden Emms, MD, [facility]  Electronically Signed    KML/MEDQ  D:  04/03/2006  T:  04/04/2006  Job:  [id number]

## 2010-10-08 NOTE — H&P (Signed)
NAME:  Alexander Mack, HALL NO.:  [id number]   MEDICAL RECORD NO.:  [id number]          PATIENT TYPE:  EMS   LOCATION:  MAJO                         FACILITY:  [facility]   PHYSICIAN:  Melissa L. Ladona Ridgel, MD  DATE OF BIRTH:  08/18/1960   DATE OF ADMISSION:  03/13/2005  DATE OF DISCHARGE:                                HISTORY & PHYSICAL   CHIEF COMPLAINT:  My chest started aching.   PRIMARY CARE PHYSICIAN:  Used to be [facility]; however, he has not seen  them in over six months.   HISTORY OF PRESENT ILLNESS:  The patient is a 50 year old African-American  male who was eating a pork chop when all of a sudden he developed left-sided  chest pain, 6 out of 10. [place] Spaniel this felt more like a jabbing. He did not feel  any nausea or vomiting but then he states he developed left arm numbness  that progressed down to his leg and also earlier stated that he had some  facial numbness, although this is not reported to be by the patient but by  the emergency room physician. The patient stated then that the numbness is  now progressing over to the right side. He says this happens on occasion. He  states he still has some sensation of discomfort in the left chest but  states that it is not pain.   REVIEW OF SYSTEMS:  No fevers, no chills, no nausea, vomiting. He relates a  lump that develops in the back of his head and then drains. He denies any  musculoskeletal discomfort or chest pain before. Denies any PND or orthopnea  or reflux symptoms. No melena, no hematochezia. No diarrhea, no  constipation. All other review of systems are negative.   PAST MEDICAL HISTORY:  He states he has circulation problems. He has  diabetes. His legs swells and he complains of this neck lesion.   PAST SURGICAL HISTORY:  None.   SOCIAL HISTORY:  Denies tobacco at this time. He states he quit at the age  of 74 after 12 years of a pack a day. He states he has a beer just about  almost every day. He has used  cocaine in the past, probably last use in  January.   FAMILY HISTORY:  Mom is deceased with diabetes and hypertension. Dad is  deceased of unknown reasons. He has a daughter.   ALLERGIES:  No known drug allergies.   MEDICATIONS:  He states he is supposed to be taking Glucophage.   PHYSICAL EXAMINATION:  VITAL SIGNS:  Temperature 97.8, blood pressure  137/73, pulse is 91, respirations are 20. Saturation 96%.  GENERAL:  This is a well-developed obese African-American male in no acute  distress.  HEENT:  He is normocephalic and atraumatic. Pupils are equal, round, and  reactive to light. Extraocular movements intact. Moist mucus membranes.  Sclerae are muddy.  NECK:  Supple. There is no JVD, lymphadenopathy, and no carotid bruits. He  does have what appears to be a pilonidal cyst in the left occipital area  which is moist but not draining any pus nor is there any  expressible pus.  Neck is supple. There is no JVD. No lymph nodes and no carotid bruits.  CHEST:  Clear to auscultation. There are no rhonchi, rales, or wheezes. He  has distant breath sounds.  CARDIOVASCULAR:  Regular rate and rhythm. Very distant breath sounds. No  murmurs, gallops, or rubs are appreciated.  ABDOMEN:  Obese, nontender, nondistended with positive bowel sounds.  EXTREMITIES:  No clubbing, cyanosis, edema, or sores. His feet are intact  without any lesions. He has 1+ pulses bilaterally. His feet are warm.  NEUROLOGICAL:  He is awake, alert, and oriented. Cranial nerves II through  XII intact. Power is 5/5. DTR's are 2+. Sensation is intact. Toes are  downgoing bilaterally.   LABORATORY DATA:  Reveal a sodium of 137, potassium of 3.9, chloride of 106,  CO2 30, BUN 13, creatinine of 0.9, glucose of 227. His white count is 6.8  with a hemoglobin of 13.6, hematocrit of 40, with platelets of 312,000. PT  and INR reveal PT of 12.7 with an INR of 0.9. Point of care enzymes are  negative x1. EKG shows 86 beats per  minute with mild intraventricular  conduction delay with no ST-T wave changes. Chest x-ray shows peribronchial  thickening but no acute disease.   ASSESSMENT:  This is a 50 year old African-American male with diabetes that  has not been treated for at least the last six months. Presents with new  onset of chest pain and left-sided numbness.   PLAN:  1.  Cardiovascular:  Admit to telemetry. Rule out with serial cardiac      enzymes. We will continue stress test and cardiac evaluation in the      morning. We will obtain an EKG and 2-D echocardiogram. We will start him      on aspirin, beta blocker, and Lovenox.  2.  Pulmonary:  No current issues.  3.  GI:  We will start him on Protonix and carbohydrate-modified medium      diet.  4.  GU:  We will check a UDS, UA, C&S.  5.  Endocrine:  Diabetes noninsulin-dependent diabetes. We will check a TSH,      hemoglobin A1c, lipids, and a homocystine. We will need sliding scale      insulin and continue restarting Glucophage after looking at his liver      enzymes.  6.  Neurological:  Numbness sounds more neuropathic in origin but I will      check a MRI to insure that he has not experienced a stroke. For now, we      will cover him with Lovenox.      Melissa L. Ladona Ridgel, MD  Electronically Signed     MLT/MEDQ  D:  03/14/2005  T:  03/14/2005  Job:  [id number]

## 2010-10-08 NOTE — Discharge Summary (Signed)
NAME:  WILFERD, RITSON NO.:  [id number]   MEDICAL RECORD NO.:  [id number]          PATIENT TYPE:  INP   LOCATION:  4707                         FACILITY:  [facility]   PHYSICIAN:  Beckey Rutter, MD  DATE OF BIRTH:  [birth year]   DATE OF ADMISSION:  04/01/2006  DATE OF DISCHARGE:  04/05/2006                               DISCHARGE SUMMARY   DATE OF BIRTH:  Jan 28, 1961   PRIMARY CARE PHYSICIAN:  Unassigned.   HISTORY OF PRESENT ILLNESS:  On presentation, the patient is a 50-year-  old African-American male who presented with chest pain.  He had past  medical history of hyperlipidemia, hypertension, and diabetes, as well  as gouty arthritis.  During hospital case, the patient had stress test  done which was suggestive of abnormality, including defects of anterior  and inferior walls, and reduced ejection fraction of 40%.  So based on  that, cardiac catheterization was recommended.  It was done on April 04, 2006, by Dr. Arturo Morton. Stuckey and the conclusion of that is  minimally reduced overall left ventricular function without significant  critical stenosis, and 40% mild LAD lesions that is eccentric and the  recommendation is to continue management conservatively and discharge  today for further followup.   DISCHARGE DIAGNOSIS:  1. Chest pain, likely not due to cardiogenic origin.  2. Diabetes.  3. Hypertension.  4. Hyperlipidemia.  5. Gout.  6. Disk prolapse.   DISCHARGE MEDICATIONS:  The patient will be discharged on:  1. Aspirin 81 mg p.o. daily.  2. Naproxen 500 mg p.o. daily.  3. Protonix 40 mg p.o. daily.  4. Zocor 40 mg p.o. daily at night.  5. Glucophage 500 mg twice a day.   The patient is stable for discharge today to follow up with Dr. Charlton Haws from cardiology and his primary physician.      Beckey Rutter, MD  Electronically Signed     EME/MEDQ  D:  04/05/2006  T:  04/05/2006  Job:  [id number]

## 2010-10-08 NOTE — H&P (Signed)
NAME:  Alexander Mack, Alexander Mack NO.:  [id number]   MEDICAL RECORD NO.:  [id number]          PATIENT TYPE:  INP   LOCATION:  4707                         FACILITY:  [facility]   PHYSICIAN:  Beckey Rutter, MD  DATE OF BIRTH:  [birth year]   DATE OF ADMISSION:  04/01/2006  DATE OF DISCHARGE:                              HISTORY & PHYSICAL   PRIMARY CARE PHYSICIAN:  Unassigned for [place].   CHIEF COMPLAINT:  Chest pain.   HISTORY OF PRESENT ILLNESS:  This is a 50 year old African American male  with a past medical history of diabetes, hypertension, hyperlipidemia  and gout, presented with chest pain that has started for the last 4 days  on and off.  The chest pain is mainly left-sided and started while he  was sitting and the pain has improved.  The patient is feeling better  with movement.  The patient is 5 to 6/10 in intensity.  Not associated  with shortness of breath and the nature of the pain is a tightness like  pressure.  No associated with shortness of breath.  No radiation.  Relieved by nitroglycerin in the emergency room here.  Not associated  with nausea or vomiting.  Patient has headache, but only after he was  taking nitroglycerin.   PAST MEDICAL HISTORY:  1. Diabetes, diagnosis was done last year.  2. Hypertension.  3. Hyperlipidemia.  4. Gout.  5. Disc prolapse.   FAMILY HISTORY:  Diabetes on the mother's side.   SOCIAL HISTORY:  The patient is not a smoker.  He used to smoke, but he  quit 20 years ago.  He smoked for the duration of 7 years.  He denied  ethanol abuse.  Also, he denied any illicit drug use.  He is living with  his sister now and because of his back problem and joint problem he is  unemployed.   ALLERGIES:  PATIENT IS NOT ALLERGIC TO ANY MEDICATION.   MEDICATIONS:  Now was taking at home:  1. ____________ 40 mg at night.  2. Methocarbamol 750 mg once daily.  3. Glucophage 500 mg b.i.d.  4. Naproxen 500 mg p.r.n. for joint pain.   REVIEW  OF SYSTEMS:  Patient is complaining of a headache right now, but  he is status post nitroglycerin patch.  Otherwise, review of systems is  negative.   PHYSICAL EXAMINATION:  VITAL SIGNS:  Temperature 98.4.  Pulse 77.  Respiratory rate 14.  Blood pressure 113/79.  GENERAL EXAMINATION:  Patient is comfortable in bed in no pain right  now.  HEENT EXAMINATION:  Head is atraumatic, normocephalic.  Eye examination,  bilaterally equal and reactive to light.  Mouth examination:  No ulcer  or thrush.  NECK EXAMINATION:  The neck is supple.  No thyroid enlargement.  No  bruit on auscultation.  CHEST EXAMINATION:  Patient is obese.  He has bilateral breath sounds on  auscultation.  HEART EXAMINATION:  First and second heart sound was audible.  No added  sounds.  ABDOMEN:  Obese.  Bowel sounds positive.  No tenderness.  EXTREMITIES:  No edema.  SKIN EXAM:  Unremarkable.  NEUROLOGICALLY:  Patient is alert and oriented.  He is able to move all  extremities.   LAB TEST:  Sodium 140, potassium 3.7, chloride 107, BUN 12, creatinine  1.0, glucose 132.  Hematocrit 43.0, hemoglobin 14.6.  CK is 1.9 the  first one, the second one is 2.4.  Troponin is less than 0.05, second  one 0.06.  Chest x-ray to be reviewed.  EKG showing normal sinus rhythm  at rate of 79.  No significant ST or T-wave change.   IMPRESSION:  1. This is a 50 year old male who has a past medical history diabetes,      obesity and joint pain, now presenting with left-sided chest pain      with just a slight increase in the second troponin.  We are going      to admit him for further management and observation.  Patient will      have cardiac enzymes x3.  Patient will be admitted to monitoring      floor, telemetry.  Aspirin 325, we are going to start him on a slow      dose of beta blocker, because the blood pressure is already 113,      79.  Pain medication for the headache.  We are going to continue      oxygen and we are going to  monitor with serial EKG as well.  2. For diabetes, we are going hold on the Glucophage for tonight, to      be reinstated tomorrow, after the echo result on the rest of the      troponin and the rest of the cardiac enzymes.  We are going to      start him on a sliding scale while in the hospital.  3. Hyperlipidemia.  Patient was taking __________ 40 mg.  We are going      to continue on 80 mg tonight to be adjusted as per the lipid and      cholesterol profile.  We are going to order a hemoglobin A1c also      for tomorrow.  4. Deep venous thrombosis prophylaxis.  Lovenox 40 and GI prophylaxis      will be Protonix 40 mg.      Beckey Rutter, MD  Electronically Signed     EME/MEDQ  D:  04/01/2006  T:  04/02/2006  Job:  [id number]

## 2010-10-08 NOTE — Cardiovascular Report (Signed)
NAME:  Alexander Mack, Alexander Mack NO.:  [id number]   MEDICAL RECORD NO.:  [id number]          PATIENT TYPE:  INP   LOCATION:  4707                         FACILITY:  [facility]   PHYSICIAN:  Arturo Morton. Riley Kill, MD, FACCDATE OF BIRTH:  09-04-1960   DATE OF PROCEDURE:  04/04/2006  DATE OF DISCHARGE:                              CARDIAC CATHETERIZATION   INDICATIONS:  Mr. Parco is a 50 year old gentleman who presents with  nonexertional chest pain.  It is somewhat atypical.  His EKGs have been  unremarkable, and his enzymes have been negative.  D-dimer is normal.  The  patient had an adenosine Cardiolite performed which suggested an abnormality  including defects in the anterior and inferior walls and reduced EF of 40%.  Based on this, cardiac catheterization was recommended.  We discussed with  the patient the risks and benefits, and he was agreeable to proceed.   PROCEDURE:  1. Left heart catheterization.  2. Selective coronary arteriography.  3. Selective left ventriculography.   DESCRIPTION OF PROCEDURE:  The patient was brought to the cath lab and  prepped and draped in the usual fashion.  We used an anterior puncture with  a Smart needle and were able to gain immediate access to the right femoral  artery in the appropriate position.  A 6-French sheath was placed.  Views of  the left and right coronary arteries were then obtained in multiple  angiographic projections.  Central aortic and left ventricular pressures  were measured with a pigtail.  Ventriculography was then performed in the  RAO projection.  The patient tolerated procedure without complication and  was taken to the holding area in satisfactory clinical condition.   HEMODYNAMIC DATA:  1. Central aortic pressure 127/88, mean 106.  2. Left ventricular pressure 127/17.  3. There is no gradient pullback across aortic valve.   ANGIOGRAPHIC DATA:  1. Ventriculography was done in the RAO projection.  Because  of      ventricular ectopy, an accurate ejection fraction could not be      calculated.  An estimate on a post PVC beat was in the range of about      50%.  2. The left main coronary artery is a large-caliber vessel being at least      6 mm and without significant narrowing.  3. The left anterior descending artery courses to the apex.  There is a      first diagonal branch and septal perforator which are without critical      narrowing.  There is a second diagonal that has about 30-40% mid      stenosis of the LAD after the second diagonal has an eccentric plaque      that measures about 30-40% in luminal reduction.  However, the lumen      itself is widely patent.  The distal LAD wraps the apex.  The lesion      does not appear to be flow-limiting in the LAD.  4. The circumflex is a large caliber vessel.  There is probably about 20-      30% narrowing in the proximal vessel.  This then provides a fairly      large marginal branch and then an AV circumflex, neither of which      appears to have high-grade critical disease.  5. The right coronary artery provides a posterior descending and then a      small posterolateral branch that is without significant focal      narrowing.   CONCLUSIONS:  1. Minimally reduced overall left ventricular function without significant      critical stenosis.  2. 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   DISPOSITION:  We plan to recheck the patient's D-dimer.  However, his  initial D-dimer was normal.  The exact etiology of the patient's chest pain  is not clear.  The plan will be to discharge him in the morning most likely.      Arturo Morton. Riley Kill, MD, [facility]  Electronically Signed     TDS/MEDQ  D:  04/04/2006  T:  04/04/2006  Job:  [id number]   cc:   CV Laboratory  Noralyn Pick. Eden Emms, MD, [facility]

## 2010-10-23 ENCOUNTER — Emergency Department (HOSPITAL_COMMUNITY)
Admission: EM | Admit: 2010-10-23 | Discharge: 2010-10-23 | Disposition: A | Discharge status: Home / Self Care | Payer: Medicare Other | Attending: Emergency Medicine | Admitting: Emergency Medicine

## 2010-10-23 DIAGNOSIS — E7800 Pure hypercholesterolemia, unspecified: Secondary | ICD-10-CM | POA: Insufficient documentation

## 2010-10-23 DIAGNOSIS — I10 Essential (primary) hypertension: Secondary | ICD-10-CM | POA: Insufficient documentation

## 2010-10-23 DIAGNOSIS — M549 Dorsalgia, unspecified: Secondary | ICD-10-CM | POA: Insufficient documentation

## 2010-10-23 DIAGNOSIS — E119 Type 2 diabetes mellitus without complications: Secondary | ICD-10-CM | POA: Insufficient documentation

## 2010-10-23 DIAGNOSIS — N390 Urinary tract infection, site not specified: Secondary | ICD-10-CM | POA: Insufficient documentation

## 2010-10-23 LAB — URINALYSIS, ROUTINE W REFLEX MICROSCOPIC
Glucose, UA: NEGATIVE mg/dL
Protein, ur: NEGATIVE mg/dL

## 2010-10-23 LAB — CBC
HCT: 36 % — ABNORMAL LOW (ref 39.0–52.0)
Hemoglobin: 11.9 g/dL — ABNORMAL LOW (ref 13.0–17.0)
MCH: 27 pg (ref 26.0–34.0)
MCHC: 33.1 g/dL (ref 30.0–36.0)
MCV: 81.6 fL (ref 78.0–100.0)
Platelets: 221 10*3/uL (ref 150–400)
RBC: 4.41 MIL/uL (ref 4.22–5.81)
RDW: 14.4 % (ref 11.5–15.5)
WBC: 12.2 10*3/uL — ABNORMAL HIGH (ref 4.0–10.5)

## 2010-10-23 LAB — DIFFERENTIAL
Basophils Absolute: 0 10*3/uL (ref 0.0–0.1)
Basophils Relative: 0 % (ref 0–1)
Eosinophils Absolute: 0.1 10*3/uL (ref 0.0–0.7)
Eosinophils Relative: 1 % (ref 0–5)
Lymphocytes Relative: 14 % (ref 12–46)
Lymphs Abs: 1.7 10*3/uL (ref 0.7–4.0)
Monocytes Absolute: 1.4 10*3/uL — ABNORMAL HIGH (ref 0.1–1.0)
Monocytes Relative: 11 % (ref 3–12)
Neutro Abs: 9.1 10*3/uL — ABNORMAL HIGH (ref 1.7–7.7)
Neutrophils Relative %: 74 % (ref 43–77)

## 2010-10-23 LAB — POCT I-STAT, CHEM 8
Creatinine, Ser: 0.9 mg/dL (ref 0.4–1.5)
Glucose, Bld: 94 mg/dL (ref 70–99)
Hemoglobin: 12.6 g/dL — ABNORMAL LOW (ref 13.0–17.0)
Sodium: 139 mEq/L (ref 135–145)
TCO2: 26 mmol/L (ref 0–100)

## 2010-10-23 LAB — URINE MICROSCOPIC-ADD ON

## 2010-10-25 LAB — URINE CULTURE

## 2010-11-23 ENCOUNTER — Encounter: Payer: Self-pay | Admitting: Internal Medicine

## 2011-01-01 ENCOUNTER — Emergency Department (HOSPITAL_COMMUNITY)
Admission: EM | Admit: 2011-01-01 | Discharge: 2011-01-01 | Disposition: A | Discharge status: Home / Self Care | Payer: Medicare Other | Attending: Endocrinology | Admitting: Endocrinology

## 2011-01-01 DIAGNOSIS — L02619 Cutaneous abscess of unspecified foot: Secondary | ICD-10-CM | POA: Insufficient documentation

## 2011-01-01 DIAGNOSIS — E119 Type 2 diabetes mellitus without complications: Secondary | ICD-10-CM | POA: Insufficient documentation

## 2011-01-01 DIAGNOSIS — I10 Essential (primary) hypertension: Secondary | ICD-10-CM | POA: Insufficient documentation

## 2011-02-11 LAB — URINALYSIS, ROUTINE W REFLEX MICROSCOPIC
Bilirubin Urine: NEGATIVE
Hgb urine dipstick: NEGATIVE
Protein, ur: NEGATIVE
Urobilinogen, UA: 0.2

## 2011-02-15 LAB — URINALYSIS, ROUTINE W REFLEX MICROSCOPIC
Glucose, UA: NEGATIVE
Hgb urine dipstick: NEGATIVE
Ketones, ur: NEGATIVE
Protein, ur: NEGATIVE

## 2011-02-17 LAB — URINE CULTURE
Colony Count: NO GROWTH
Culture: NO GROWTH

## 2011-02-17 LAB — URINALYSIS, ROUTINE W REFLEX MICROSCOPIC
Bilirubin Urine: NEGATIVE
Specific Gravity, Urine: 1.015
Urobilinogen, UA: 0.2

## 2011-02-17 LAB — URINE MICROSCOPIC-ADD ON

## 2011-02-17 LAB — GC/CHLAMYDIA PROBE AMP, GENITAL: Chlamydia, DNA Probe: NEGATIVE

## 2011-02-18 ENCOUNTER — Emergency Department (HOSPITAL_COMMUNITY): Payer: Medicare Other

## 2011-02-18 ENCOUNTER — Observation Stay (HOSPITAL_COMMUNITY)
Admission: EM | Admit: 2011-02-18 | Discharge: 2011-02-21 | Disposition: A | Discharge status: Home / Self Care | Payer: Medicare Other | Attending: Internal Medicine | Admitting: Internal Medicine

## 2011-02-18 DIAGNOSIS — R079 Chest pain, unspecified: Principal | ICD-10-CM | POA: Insufficient documentation

## 2011-02-18 DIAGNOSIS — I2510 Atherosclerotic heart disease of native coronary artery without angina pectoris: Secondary | ICD-10-CM | POA: Insufficient documentation

## 2011-02-18 DIAGNOSIS — E785 Hyperlipidemia, unspecified: Secondary | ICD-10-CM | POA: Insufficient documentation

## 2011-02-18 DIAGNOSIS — E119 Type 2 diabetes mellitus without complications: Secondary | ICD-10-CM | POA: Insufficient documentation

## 2011-02-18 DIAGNOSIS — F3289 Other specified depressive episodes: Secondary | ICD-10-CM | POA: Insufficient documentation

## 2011-02-18 DIAGNOSIS — Z23 Encounter for immunization: Secondary | ICD-10-CM | POA: Insufficient documentation

## 2011-02-18 DIAGNOSIS — F329 Major depressive disorder, single episode, unspecified: Secondary | ICD-10-CM | POA: Insufficient documentation

## 2011-02-18 DIAGNOSIS — I10 Essential (primary) hypertension: Secondary | ICD-10-CM | POA: Insufficient documentation

## 2011-02-18 DIAGNOSIS — G4733 Obstructive sleep apnea (adult) (pediatric): Secondary | ICD-10-CM | POA: Insufficient documentation

## 2011-02-18 LAB — COMPREHENSIVE METABOLIC PANEL
ALT: 33 U/L (ref 0–53)
AST: 34 U/L (ref 0–37)
CO2: 29 mEq/L (ref 19–32)
Calcium: 9.5 mg/dL (ref 8.4–10.5)
Creatinine, Ser: 0.87 mg/dL (ref 0.50–1.35)
GFR calc Af Amer: >60 mL/min (ref >60)
GFR calc non Af Amer: >60 mL/min (ref >60)
Glucose, Bld: 111 mg/dL — ABNORMAL HIGH (ref 70–99)
Sodium: 138 mEq/L (ref 135–145)
Total Protein: 7.7 g/dL (ref 6.0–8.3)

## 2011-02-18 LAB — CBC
HCT: 42.3
MCH: 27.3 pg (ref 26.0–34.0)
MCHC: 32.5
MCHC: 33.5 g/dL (ref 30.0–36.0)
MCV: 84.6
MCV: 81.5 fL (ref 78.0–100.0)
Platelets: 273
Platelets: 266 10*3/uL (ref 150–400)
RDW: 14.4

## 2011-02-18 LAB — POCT CARDIAC MARKERS
Myoglobin, poc: 43.3
Operator id: 277751

## 2011-02-18 LAB — POCT I-STAT, CHEM 8
Calcium, Ion: 1.21
HCT: 45
Hemoglobin: 15.3
Sodium: 142
TCO2: 31

## 2011-02-18 LAB — DIFFERENTIAL
Basophils Absolute: 0
Basophils Relative: 0
Basophils Relative: 0 % (ref 0–1)
Eosinophils Absolute: 0.1
Eosinophils Absolute: 0.1 10*3/uL (ref 0.0–0.7)
Eosinophils Relative: 2
Lymphocytes Relative: 28
Lymphs Abs: 2.1 10*3/uL (ref 0.7–4.0)
Monocytes Absolute: 0.4 10*3/uL (ref 0.1–1.0)
Monocytes Relative: 6 % (ref 3–12)

## 2011-02-18 LAB — CK TOTAL AND CKMB (NOT AT ARMC)
CK, MB: 3.5 ng/mL (ref 0.3–4.0)
Relative Index: 0.6 (ref 0.0–2.5)

## 2011-02-18 LAB — TROPONIN I: Troponin I: <0.3 ng/mL (ref <0.30)

## 2011-02-19 LAB — GLUCOSE, CAPILLARY
Glucose-Capillary: 117 mg/dL — ABNORMAL HIGH (ref 70–99)
Glucose-Capillary: 151 mg/dL — ABNORMAL HIGH (ref 70–99)
Glucose-Capillary: 105 mg/dL — ABNORMAL HIGH (ref 70–99)

## 2011-02-19 LAB — CK TOTAL AND CKMB (NOT AT ARMC): Relative Index: 0.6 (ref 0.0–2.5)

## 2011-02-19 LAB — LIPID PANEL
Cholesterol: 150 mg/dL (ref 0–200)
HDL: 41 mg/dL (ref >39)
LDL Cholesterol: 87 mg/dL (ref 0–99)
Triglycerides: 110 mg/dL (ref <150)

## 2011-02-19 LAB — BASIC METABOLIC PANEL
BUN: 10 mg/dL (ref 6–23)
CO2: 30 mEq/L (ref 19–32)
Chloride: 100 mEq/L (ref 96–112)
Creatinine, Ser: 0.9 mg/dL (ref 0.50–1.35)
GFR calc Af Amer: >60 mL/min (ref >60)
Glucose, Bld: 152 mg/dL — ABNORMAL HIGH (ref 70–99)
Potassium: 3.8 mEq/L (ref 3.5–5.1)

## 2011-02-19 LAB — TSH: TSH: 1.754 u[IU]/mL (ref 0.350–4.500)

## 2011-02-19 LAB — HEMOGLOBIN A1C
Hgb A1c MFr Bld: 7.4 % — ABNORMAL HIGH (ref <5.7)
Mean Plasma Glucose: 166 mg/dL — ABNORMAL HIGH (ref <117)

## 2011-02-19 LAB — CARDIAC PANEL(CRET KIN+CKTOT+MB+TROPI)
CK, MB: 3 ng/mL (ref 0.3–4.0)
Relative Index: 0.6 (ref 0.0–2.5)
Troponin I: <0.3 ng/mL (ref <0.30)

## 2011-02-19 NOTE — Discharge Summary (Signed)
  NAME:  Alexander Mack, CWIKLA NO.:  [id number]  MEDICAL RECORD NO.:  [id number]  LOCATION:                                 FACILITY:  PHYSICIAN:  Armanda Magic, M.D.     DATE OF BIRTH:  1960/07/21  DATE OF ADMISSION: DATE OF DISCHARGE:                              DISCHARGE SUMMARY   PRIMARY CARDIOLOGIST:  Pricilla Riffle, MD, [facility]  CHIEF COMPLAINT:  Chest pain.  HISTORY OF PRESENT ILLNESS:  This is a 50 year old male with a history of diabetes, hypertension, coronary disease by cath in 2007 which showed a 30-40% distal LAD, 20-30% left circumflex, and 30-40% diagonal 2 who presented to the emergency room with complaints chest pain.  Chest pain started around 7:00 p.m. last night with heaviness on the left side of his chest, 7/10 at its maximum with no radiation.  The chest pain last about 10 minutes and then resolved but then reoccurred and lasted another 10 minutes.  He has not had any further chest pain since then. He denied any nausea, vomiting, or shortness of breath but was diaphoretic.  PAST MEDICAL HISTORY:  Nonobstructive coronary disease by cath in 2007 as stated above, hypertension, type 2 diabetes mellitus, morbid obesity, obstructive sleep apnea, dyslipidemia, gout, depression.  PAST SURGICAL HISTORY:  Status post appendectomy.  ALLERGIES:  SHELLFISH and IV CONTRAST.  MEDICATION:  Atorvastatin, dextroamphetamine, hydroxyzine, metformin, and trazodone.  SOCIAL HISTORY:  He lives at home with his daughter.  He denies any tobacco or alcohol use.  No IV drug use.  FAMILY HISTORY:  His mother had diabetes and hypertension.  His brother had diabetes.  REVIEW OF SYSTEMS:  Otherwise as stated in the HPI is negative.  PHYSICAL EXAMINATION:  VITAL SIGNS:  Blood pressure is 107/60, heart rate 80. GENERAL:  This is a well-developed obese black male in no acute distress. HEENT:  Benign. NECK:  Supple without lymphadenopathy, no bruits. LUNGS:  Clear to  auscultation. HEART:  Regular rate and rhythm with no murmurs, rubs, or gallops. ABDOMEN:  Soft, obese, nontender, nondistended with active bowel sounds. EXTREMITIES:  No cyanosis, erythema, or edema.  LABORATORY DATA:  Sodium 138, potassium 4, chloride 101, CO2 29, BUN 7, creatinine 0.87, white cell count 5.8, hemoglobin 13.3, hematocrit 39.6, platelet count 266,000.  LFTs are normal.  CPKs are 635 and 558, MB 3.5 and 3.4, troponin less than 0.3 x2.  Chest x-ray shows no active disease.  EKG shows sinus rhythm with no ST changes.  ASSESSMENT: 1. Chest pain with negative cardiac enzymes x2 except for elevated CPK     but his MB and troponins are all normal. 2. Nonobstructive coronary disease by cath in 2007. 3. Type 2 diabetes mellitus. 4. Hypertension. 5. Obstructive sleep apnea.  PLAN:  We will make n.p.o. after midnight.  We will get a 2-day Lexiscan Cardiolite.  Further workup pending results of that.     Armanda Magic, M.D.     TT/MEDQ  D:  02/19/2011  T:  02/19/2011  Job:  [id number]  cc:   Pricilla Riffle, MD, [facility]  Electronically Signed by Armanda Magic M.D. on 02/19/2011 04:55:25 PM

## 2011-02-20 ENCOUNTER — Observation Stay (HOSPITAL_COMMUNITY): Payer: Medicare Other

## 2011-02-20 LAB — GLUCOSE, CAPILLARY
Glucose-Capillary: 105 mg/dL — ABNORMAL HIGH (ref 70–99)
Glucose-Capillary: 160 mg/dL — ABNORMAL HIGH (ref 70–99)
Glucose-Capillary: 89 mg/dL (ref 70–99)

## 2011-02-20 LAB — LIPID PANEL
HDL: 38 mg/dL — ABNORMAL LOW (ref >39)
LDL Cholesterol: 82 mg/dL (ref 0–99)

## 2011-02-20 NOTE — H&P (Signed)
NAME:  Alexander Mack, Alexander Mack NO.:  [id number]  MEDICAL RECORD NO.:  [id number]  LOCATION:  [facility]                         FACILITY:  [facility]  PHYSICIAN:  Della Goo, M.D. DATE OF BIRTH:  November 08, 1960  DATE OF ADMISSION:  02/18/2011 DATE OF DISCHARGE:                             HISTORY & PHYSICAL   DATE OF ADMISSION:  February 19, 2011  PRIMARY CARE PHYSICIAN:  Fleet Contras, M.D.  CHIEF COMPLAINT:  Chest pain.  HISTORY OF PRESENT ILLNESS:  This is a 50 year old male with a history of type II diabetes mellitus, dyslipidemia, hypertension, morbid obesity, and gout who presents to the Emergency Department with flights of chest pain which started at about 7 p.m. on February 18, 2011.  The patient states that he had heaviness in the left side of his chest. That was no radiation of this pain.  The pain at the worst was 7/10 in and the pain lasted about 10 minutes and been recurred.  The patient states that he had no shortness of breath.  No nausea or vomiting, but he did have diaphoresis.  The patient denies having any fevers or chills.  The patient denies having any recent similar episodes, however he does report having a stress test in the remote past.  PAST MEDICAL HISTORY:  As mentioned above, also history of gout and depression.  PAST SURGICAL HISTORY:  Appendectomy.  MEDICATIONS:  Atorvastatin, dextroamphetamine, hydroxyzine, metformin, and trazodone.  ALLERGIES: 1. SHELLFISH. 2. IV CONTRAST DYE.  SOCIAL HISTORY:  The patient lives at home with his daughter.  He is a nonsmoker, nondrinker.  No history of illicit drug usage.  FAMILY HISTORY:  Positive for diabetic disease and hypertension in his mother.  Diabetic disease in his brother.  No coronary artery disease or cancer in his family.  REVIEW OF SYSTEMS:  Pertinent as mentioned above.  PHYSICAL EXAMINATION FINDINGS:  GENERAL:  This is a 50 year old morbidly obese African American male who is in  no acute distress currently. VITAL SIGNS:  Temperature 97.7, blood pressure 126/83, heart rate 87, respirations 20, and O2 sat is 99%. HEENT:  Normocephalic, atraumatic.  Pupils are equally round and reactive to light.  Extraocular movements are intact.  Funduscopic benign.  There is no scleral icterus.  Nares are patent bilaterally. Oropharynx is clear. NECK:  Supple.  Full range of motion.  No thyromegaly, adenopathy, or jugular venous distention. CARDIOVASCULAR:  Regular rate and rhythm.  No murmurs, gallops, or rubs. LUNGS:  Clear to auscultation bilaterally.  No rales, rhonchi, or wheezes. ABDOMEN:  Positive bowel sounds, soft, nontender, nondistended.  Obese. No hepatosplenomegaly. EXTREMITIES:  Without cyanosis, clubbing, or edema. NEUROLOGIC:  Nonfocal.  LABORATORY STUDIES:  WBC 5.8, hemoglobin 13.3, hematocrit 39.7, MCV 81.5, platelets 266, neutrophils 56%, and lymphocytes 36%.  Sodium 138, potassium 4.0, chloride 101, carbon dioxide 29, BUN 7, creatinine 0.87, and glucose 111.  Cardiac enzymes with a total CK of 635, CK-MB 3.5, relative index 0.6, and troponin less than 0.30.  EKG reveals nonspecific ST-segment changes, but a normal sinus rhythm. Chest x-ray reveals no acute cardiopulmonary disease process.  ASSESSMENT:  A 50 year old male being admitted with, 1. Chest pain. 2. Hypertension. 3. Type II diabetes mellitus. 4.  Dyslipidemia. 5. Gout. 6. Morbid obesity.  PLAN:  The patient will be admitted for 23-hour observation to a telemetry area for cardiac monitoring.  Cardiac enzymes will be performed.  The patient will be placed on Nitropaste, oxygen, aspirin therapy, and DVT prophylaxis at this time.  His regular medications will be further reconciled.  DVT prophylaxis will be ordered.  The patient is a full code and sliding scale insulin coverage will be ordered.     Della Goo, M.D.     HJ/MEDQ  D:  02/19/2011  T:  02/19/2011  Job:  [id number]  cc:    Fleet Contras, M.D.  Electronically Signed by Della Goo M.D. on 02/20/2011 10:32:49 PM

## 2011-02-21 ENCOUNTER — Other Ambulatory Visit (HOSPITAL_COMMUNITY): Payer: Medicare Other

## 2011-02-21 DIAGNOSIS — R079 Chest pain, unspecified: Secondary | ICD-10-CM

## 2011-02-21 LAB — POCT CARDIAC MARKERS
CKMB, poc: 1.2
CKMB, poc: <1 — ABNORMAL LOW
Troponin i, poc: <0.05
Troponin i, poc: <0.05

## 2011-02-21 LAB — POCT I-STAT, CHEM 8
BUN: 8
Creatinine, Ser: 1
Glucose, Bld: 85
Potassium: 4.3
Sodium: 139
TCO2: 28

## 2011-02-21 LAB — GLUCOSE, CAPILLARY
Glucose-Capillary: 116 mg/dL — ABNORMAL HIGH (ref 70–99)
Glucose-Capillary: 130 mg/dL — ABNORMAL HIGH (ref 70–99)

## 2011-02-21 MED ORDER — TECHNETIUM TC 99M TETROFOSMIN IV KIT
30.0000 | PACK | Freq: Once | INTRAVENOUS | Status: AC | PRN
  Administered 2011-02-21: 30 via INTRAVENOUS

## 2011-02-21 MED ORDER — TECHNETIUM TC 99M TETROFOSMIN IV KIT
30.0000 | PACK | Freq: Once | INTRAVENOUS | Status: AC | PRN
  Administered 2011-02-20: 30 via INTRAVENOUS

## 2011-03-08 LAB — LIPID PANEL
Cholesterol: 185
LDL Cholesterol: 115 — ABNORMAL HIGH
Triglycerides: 147
VLDL: 29

## 2011-03-08 LAB — HEMOGLOBIN A1C: Hgb A1c MFr Bld: 7.3 — ABNORMAL HIGH

## 2011-03-08 LAB — CARDIAC PANEL(CRET KIN+CKTOT+MB+TROPI)
CK, MB: 1.4
Relative Index: 1.1
Relative Index: 1.1
Total CK: 131
Troponin I: 0.03

## 2011-03-08 LAB — CBC
Hemoglobin: 12 — ABNORMAL LOW
MCHC: 33.8
MCV: 81.6
Platelets: 242
Platelets: 261
RDW: 14.2 — ABNORMAL HIGH
RDW: 14.2 — ABNORMAL HIGH
RDW: 14.3 — ABNORMAL HIGH
WBC: 5.4

## 2011-03-08 LAB — CK TOTAL AND CKMB (NOT AT ARMC)
CK, MB: 1.8
Relative Index: 1
Total CK: 173

## 2011-03-08 LAB — BASIC METABOLIC PANEL
BUN: 7
BUN: 7
CO2: 29
Calcium: 8.7
Calcium: 8.8
Calcium: 9.4
Chloride: 105
Creatinine, Ser: 0.87
Creatinine, Ser: 0.88
Creatinine, Ser: 0.9
GFR calc Af Amer: >60
GFR calc non Af Amer: >60
GFR calc non Af Amer: >60
Glucose, Bld: 113 — ABNORMAL HIGH
Glucose, Bld: 160 — ABNORMAL HIGH
Sodium: 140

## 2011-03-08 LAB — RAPID URINE DRUG SCREEN, HOSP PERFORMED
Amphetamines: NOT DETECTED
Barbiturates: NOT DETECTED
Benzodiazepines: NOT DETECTED

## 2011-03-08 LAB — POCT CARDIAC MARKERS
Operator id: 4533
Operator id: 4533
Troponin i, poc: <0.05

## 2011-03-08 LAB — DIFFERENTIAL
Basophils Absolute: 0
Basophils Relative: 0
Eosinophils Absolute: 0.2
Neutrophils Relative %: 53

## 2011-03-08 LAB — D-DIMER, QUANTITATIVE: D-Dimer, Quant: <0.22

## 2011-03-09 LAB — URINE CULTURE: Culture: NO GROWTH

## 2011-03-09 LAB — GC/CHLAMYDIA PROBE AMP, GENITAL: GC Probe Amp, Genital: NEGATIVE

## 2011-03-16 NOTE — Discharge Summary (Signed)
  NAME:  Alexander Mack, M[place] NO.:  [id number]  MEDICAL RECORD NO.:  [id number]  LOCATION:                                 FACILITY:  PHYSICIAN:  Zannie Cove, MD     DATE OF BIRTH:  1960-09-08  DATE OF ADMISSION: DATE OF DISCHARGE:                              DISCHARGE SUMMARY   PRIMARY CARE PHYSICIAN:  Fleet Contras, MD  DISCHARGE DIAGNOSES: 1. Atypical chest pain with negative stress test. 2. History of nonobstructive coronary disease based on cath in 2007,     which showed 40% left anterior descending disease. 3. Attention deficit hyperactivity disorder. 4. Type 2 diabetes. 5. Hypertension. 6. Obesity. 7. Gout. 8. Depression. 9. Dyslipidemia. 10.Obstructive sleep apnea.  DISCHARGE MEDICATIONS: 1. Aspirin 81 mg daily. 2. Centrum Silver 1 tab daily. 3. Hydroxyzine 25 mg p.o. daily p.r.n. 4. Lexapro 20 mg daily. 5. Lipitor 20 mg daily. 6. Metformin 500 mg p.o. b.i.d. 7. Alli 60 mg 1 capsule daily. Discontinued medications include dextroamphetamine.  DIAGNOSTICS/INVESTIGATIONS: 1. Chest x-ray, September 29, no acute cardiopulmonary disease. 2. Nuclear scan showed no evidence of ischemia or scar, mild     depression of LV systolic function without segmental wall motion     abnormalities.  HOSPITAL COURSE:  [name] is a 50 year old obese African American gentleman with history of hypertension, diabetes, and ADHD, who had recently been started on dextroamphetamine 3 days prior to admission by his psychiatrist, presented with substernal chest pain associated with diaphoresis.  For his substernal chest pain, he was ruled out for MI based on 3 sets of cardiac markers as well as EKG.  He had a prior cath in 2007 showed 40% LAD disease; however, given his risk factors, Cardiology consultation was obtained for his stress test, which ended up needing a 2-day stress test due to his weight, this was negative.  In addition, we suspect his symptoms were  related to and recently started on dextroamphetamine which was discontinued.  The patient remained symptom free through his hospital stay and discharged home in a stable condition with recommendations to follow lifestyle modification for obesity, hypertension as well as diabetes as well as to follow up with his primary physician, Dr. Fleet Contras in 1 week.     Zannie Cove, MD     PJ/MEDQ  D:  03/10/2011  T:  03/10/2011  Job:  [id number]  cc:   Fleet Contras, M.D.  Electronically Signed by Zannie Cove  on 03/16/2011 03:02:33 PM

## 2011-05-02 ENCOUNTER — Ambulatory Visit (INDEPENDENT_AMBULATORY_CARE_PROVIDER_SITE_OTHER): Payer: Medicare Other | Admitting: Internal Medicine

## 2011-05-02 ENCOUNTER — Encounter: Payer: Self-pay | Admitting: Internal Medicine

## 2011-05-02 DIAGNOSIS — I2510 Atherosclerotic heart disease of native coronary artery without angina pectoris: Secondary | ICD-10-CM

## 2011-05-02 DIAGNOSIS — I10 Essential (primary) hypertension: Secondary | ICD-10-CM

## 2011-05-02 DIAGNOSIS — E785 Hyperlipidemia, unspecified: Secondary | ICD-10-CM

## 2011-05-02 NOTE — Progress Notes (Signed)
HPI Alexander Mack is a 50 year old. He has a history of very mild CAD by catheter in 2007. Myoview scan at that time showed no ischemia. (LAD showed a 30-40% distal narrowing. Circumflex had a 20-30% proximal narrowing. D2 had a 30 to 40% narrowing). I last the patient in July of last year.  Lipids at that time were very good. I saw him in clinic in June 2011No Known Allergies Lipids in September LDL was 82, HDL was 38.  Trig 126. SInce I saw him he has done well.  He denies CP.  No SOB  Using CPAP He works out a [employer] at least a few times per week.  Current Outpatient Prescriptions  Medication Sig Dispense Refill  . aspirin (ASPIR-LOW) 81 MG EC tablet Take 81 mg by mouth as needed.       Marland Kitchen atorvastatin (LIPITOR) 20 MG tablet Take 20 mg by mouth daily.        Marland Kitchen doxycycline (VIBRAMYCIN) 100 MG capsule Take 100 mg by mouth as needed.        Marland Kitchen escitalopram (LEXAPRO) 20 MG tablet Take 20 mg by mouth daily.        . metFORMIN (GLUCOPHAGE) 500 MG tablet Take 500 mg by mouth 2 (two) times daily.        . Multiple Vitamins-Minerals (CENTRUM SILVER ULTRA MENS) TABS occasionally      . traZODone (DESYREL) 100 MG tablet Take 100 mg by mouth at bedtime.          Past Medical History  Diagnosis Date  . CAD (coronary artery disease)   . Obese   . Dyslipidemia   . Diabetes mellitus   . History of arthroscopy   . 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 Plantar fascial fibromatosis   . Nonspecific abnormal results of liver function study   . Personal history of noncompliance with medical treatment, presenting hazards to health   . Unspecified essential hypertension   . Alcohol abuse   . Sexually transmitted disease   . Drug dependence     Past Surgical History  Procedure Date  . Appendectomy     Family History  Problem Relation Age of Onset  . Heart disease Mother   . Diabetes Mother     History   Social History  . Marital Status: Single    Spouse Name:  N/A    Number of Children: N/A  . Years of Education: N/A   Occupational History  . Not on file.   Social History Main Topics  . Smoking status: Never Smoker   . Smokeless tobacco: Not on file  . Alcohol Use: No  . Drug Use: No  . Sexually Active:    Other Topics Concern  . Not on file   Social History Narrative   Single. Lives with child. Disability     Review of Systems:  All systems reviewed.  They are negative to the above problem except as previously stated.  Vital Signs: BP 108/78  Pulse 95  Ht 5\' 6"  (1.676 m)  Wt 293 lb (132.904 kg)  BMI 47.29 kg/m2  Physical Exam Patient an  Obese 50 year old in NAD HEENT:  Normocephalic, atraumatic. EOMI, PERRLA.  Neck: JVP is normal. No thyromegaly. No bruits.  Lungs: clear to auscultation. No rales no wheezes.  Heart: Regular rate and rhythm. Normal S1, S2. No S3.   No significant murmurs. PMI not displaced.  Abdomen:  Supple, nontender. Normal bowel sounds. No  masses. No hepatomegaly.  Extremities:   Good distal pulses throughout. No lower extremity edema.  Musculoskeletal :moving all extremities.  Neuro:   alert and oriented x3.  CN II-XII grossly intact.   Assessment and Plan:

## 2011-05-02 NOTE — Patient Instructions (Signed)
Your physician wants you to follow-up in:  12 months.  You will receive a reminder letter in the mail two months in advance. If you don't receive a letter, please call our office to schedule the follow-up appointment.   

## 2011-05-02 NOTE — Assessment & Plan Note (Signed)
Patient's BP is good.  Keep on same regimen.

## 2011-05-02 NOTE — Assessment & Plan Note (Signed)
No symptoms of angina.  Keep on same regimen.  Stay active.  Try to drop wt.

## 2011-05-02 NOTE — Assessment & Plan Note (Signed)
Keep on same regimen.  Lipids are good.

## 2011-07-27 ENCOUNTER — Ambulatory Visit: Payer: Medicare Other | Admitting: Pulmonary Disease

## 2011-08-05 ENCOUNTER — Encounter (HOSPITAL_COMMUNITY): Payer: Self-pay | Admitting: *Deleted

## 2011-08-05 ENCOUNTER — Emergency Department (HOSPITAL_COMMUNITY)
Admission: EM | Admit: 2011-08-05 | Discharge: 2011-08-06 | Disposition: A | Discharge status: Home / Self Care | Payer: Medicare Other | Attending: Emergency Medicine | Admitting: Emergency Medicine

## 2011-08-05 DIAGNOSIS — Z79899 Other long term (current) drug therapy: Secondary | ICD-10-CM | POA: Insufficient documentation

## 2011-08-05 DIAGNOSIS — I2510 Atherosclerotic heart disease of native coronary artery without angina pectoris: Secondary | ICD-10-CM | POA: Insufficient documentation

## 2011-08-05 DIAGNOSIS — E119 Type 2 diabetes mellitus without complications: Secondary | ICD-10-CM | POA: Insufficient documentation

## 2011-08-05 DIAGNOSIS — I10 Essential (primary) hypertension: Secondary | ICD-10-CM | POA: Insufficient documentation

## 2011-08-05 DIAGNOSIS — E785 Hyperlipidemia, unspecified: Secondary | ICD-10-CM | POA: Insufficient documentation

## 2011-08-05 DIAGNOSIS — S239XXA Sprain of unspecified parts of thorax, initial encounter: Secondary | ICD-10-CM | POA: Insufficient documentation

## 2011-08-05 DIAGNOSIS — X58XXXA Exposure to other specified factors, initial encounter: Secondary | ICD-10-CM | POA: Insufficient documentation

## 2011-08-05 DIAGNOSIS — M549 Dorsalgia, unspecified: Secondary | ICD-10-CM

## 2011-08-05 DIAGNOSIS — T148XXA Other injury of unspecified body region, initial encounter: Secondary | ICD-10-CM

## 2011-08-05 DIAGNOSIS — E669 Obesity, unspecified: Secondary | ICD-10-CM | POA: Insufficient documentation

## 2011-08-05 NOTE — ED Notes (Signed)
Pt stated that he was doing nothing when his upper back began to hurt x 3 days ago.  Pt denies mechanism of injury or history of same.

## 2011-08-06 LAB — URINALYSIS, ROUTINE W REFLEX MICROSCOPIC
Hgb urine dipstick: NEGATIVE
Protein, ur: NEGATIVE mg/dL
Urobilinogen, UA: 0.2 mg/dL (ref 0.0–1.0)

## 2011-08-06 MED ORDER — CYCLOBENZAPRINE HCL 10 MG PO TABS: 10.0000 mg | ORAL_TABLET | Freq: Three times a day (TID) | ORAL | Status: AC | PRN

## 2011-08-06 MED ORDER — NAPROXEN 500 MG PO TABS: 500.0000 mg | ORAL_TABLET | Freq: Two times a day (BID) | ORAL | Status: DC

## 2011-08-06 MED ORDER — IBUPROFEN 800 MG PO TABS: 800.0000 mg | ORAL_TABLET | Freq: Once | ORAL | Status: DC

## 2011-08-06 NOTE — ED Provider Notes (Signed)
History     CSN: [id number]  Arrival date & time 08/05/11  2204   First MD Initiated Contact with Patient 08/06/11 0146      Chief Complaint  Patient presents with  . Back Pain     HPI  History provided by the patient. Patient is a 51 year old now with history of diabetes, hyperlipidemia, CAD, alcohol abuse and drug dependence who presents with complaints of mid to upper back pain and soreness for the past 3 days. Pain has been persistent. Pain is worse in certain positions while lying down. Pain is also better in certain positions. Patient denies any trauma or strenuous activity prior to onset. Patient has not taken any medications for symptoms. He denies any other aggravating or alleviating factors. Symptoms are described as moderate. Symptoms have been associated with some urinary frequency and dark urine color. Patient also reports slight nausea. Patient denies any associated fever, chills, sweats, vomiting, chest pain, shortness of breath, abdominal pain, flank pain, hematuria, diarrhea or constipation.    Past Medical History  Diagnosis Date  . CAD (coronary artery disease)   . Obese   . Dyslipidemia   . Diabetes mellitus   . History of arthroscopy   . 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 Plantar fascial fibromatosis   . Nonspecific abnormal results of liver function study   . Personal history of noncompliance with medical treatment, presenting hazards to health   . Unspecified essential hypertension   . Alcohol abuse   . Sexually transmitted disease   . Drug dependence     Past Surgical History  Procedure Date  . Appendectomy     Family History  Problem Relation Age of Onset  . Heart disease Mother   . Diabetes Mother     History  Substance Use Topics  . Smoking status: Never Smoker   . Smokeless tobacco: Not on file  . Alcohol Use: No      Review of Systems  Constitutional: Negative for fever and chills.    Gastrointestinal: Positive for nausea. Negative for vomiting, abdominal pain, diarrhea and constipation.  Genitourinary: Positive for dysuria and frequency. Negative for hematuria and flank pain.  Musculoskeletal: Positive for back pain.  All other systems reviewed and are negative.    Allergies  Review of patient's allergies indicates no known allergies.  Home Medications   Current Outpatient Rx  Name Route Sig Dispense Refill  . ATORVASTATIN CALCIUM 20 MG PO TABS Oral Take 20 mg by mouth daily.      . COD LIVER OIL PO Oral Take 1 capsule by mouth daily.    Marland Kitchen ESCITALOPRAM OXALATE 20 MG PO TABS Oral Take 20 mg by mouth daily.      Marland Kitchen METFORMIN HCL 500 MG PO TABS Oral Take 500 mg by mouth 2 (two) times daily.      . COLON CLEANSER PO Oral Take 1 tablet by mouth daily.    . TRAZODONE HCL 100 MG PO TABS Oral Take 100 mg by mouth at bedtime.      Marland Kitchen VITAMIN E PO Oral Take 1 capsule by mouth daily.      BP 117/78  Pulse 72  Temp(Src) 98 F (36.7 C) (Oral)  Resp 19  SpO2 100%  Physical Exam  Nursing note and vitals reviewed. Constitutional: He is oriented to person, place, and time. He appears well-developed and well-nourished.  HENT:  Head: Normocephalic and atraumatic.  Neck: Normal range of motion. Neck supple.  Cardiovascular:  Normal rate and regular rhythm.   Pulmonary/Chest: Effort normal and breath sounds normal. No respiratory distress. He has no wheezes. He has no rales.  Abdominal: Soft. He exhibits no distension. There is no tenderness. There is no rebound, no guarding and no CVA tenderness.  Musculoskeletal:       Cervical back: Normal.       Thoracic back: He exhibits no bony tenderness.       Lumbar back: Normal.       Back:  Neurological: He is alert and oriented to person, place, and time.  Skin: Skin is warm. No rash noted.  Psychiatric: He has a normal mood and affect. His behavior is normal.    ED Course  Procedures   Results for orders placed during  the hospital encounter of 08/05/11  URINALYSIS, ROUTINE W REFLEX MICROSCOPIC      Component Value Range   Color, Urine YELLOW  YELLOW    APPearance CLEAR  CLEAR    Specific Gravity, Urine 1.026  1.005 - 1.030    pH 5.0  5.0 - 8.0    Glucose, UA 100 (*) NEGATIVE (mg/dL)   Hgb urine dipstick NEGATIVE  NEGATIVE    Bilirubin Urine NEGATIVE  NEGATIVE    Ketones, ur NEGATIVE  NEGATIVE (mg/dL)   Protein, ur NEGATIVE  NEGATIVE (mg/dL)   Urobilinogen, UA 0.2  0.0 - 1.0 (mg/dL)   Nitrite NEGATIVE  NEGATIVE    Leukocytes, UA NEGATIVE  NEGATIVE        1. Back pain   2. Muscle strain       MDM  Patient seen and evaluated. Patient in no acute distress.        Angus Seller, [place] 08/07/11 1627

## 2011-08-06 NOTE — Discharge Instructions (Signed)
Your urine today was normal didn't show any concerning signs. Your providers today for your symptoms are caused from muscle strain and soreness. Please continue to followup with primary care provider for continued evaluation and treatment or your back specialist. If you have any worsening symptoms or develop new concerning symptoms please return to the emergency room.  Back Pain, Adult Low back pain is very common. About 1 in 5 people have back pain.The cause of low back pain is rarely dangerous. The pain often gets better over time.About half of people with a sudden onset of back pain feel better in just 2 weeks. About 8 in 10 people feel better by 6 weeks.  CAUSES Some common causes of back pain include:  Strain of the muscles or ligaments supporting the spine.   Wear and tear (degeneration) of the spinal discs.   Arthritis.   Direct injury to the back.  DIAGNOSIS Most of the time, the direct cause of low back pain is not known.However, back pain can be treated effectively even when the exact cause of the pain is unknown.Answering your caregiver's questions about your overall health and symptoms is one of the most accurate ways to make sure the cause of your pain is not dangerous. If your caregiver needs more information, he or she may order lab work or imaging tests (X-rays or MRIs).However, even if imaging tests show changes in your back, this usually does not require surgery. HOME CARE INSTRUCTIONS For many people, back pain returns.Since low back pain is rarely dangerous, it is often a condition that people can learn to [facility] their own.   Remain active. It is stressful on the back to sit or stand in one place. Do not sit, drive, or stand in one place for more than 30 minutes at a time. Take short walks on level surfaces as soon as pain allows.Try to increase the length of time you walk each day.   Do not stay in bed.Resting more than 1 or 2 days can delay your recovery.   Do  not avoid exercise or work.Your body is made to move.It is not dangerous to be active, even though your back may hurt.Your back will likely heal faster if you return to being active before your pain is gone.   Pay attention to your body when you bend and lift. Many people have less discomfortwhen lifting if they bend their knees, keep the load close to their bodies,and avoid twisting. Often, the most comfortable positions are those that put less stress on your recovering back.   Find a comfortable position to sleep. Use a firm mattress and lie on your side with your knees slightly bent. If you lie on your back, put a pillow under your knees.   Only take over-the-counter or prescription medicines as directed by your caregiver. Over-the-counter medicines to reduce pain and inflammation are often the most helpful.Your caregiver may prescribe muscle relaxant drugs.These medicines help dull your pain so you can more quickly return to your normal activities and healthy exercise.   Put ice on the injured area.   Put ice in a plastic bag.   Place a towel between your skin and the bag.   Leave the ice on for 15 to 20 minutes, 3 to 4 times a day for the first 2 to 3 days. After that, ice and heat may be alternated to reduce pain and spasms.   Ask your caregiver about trying back exercises and gentle massage. This may be of  some benefit.   Avoid feeling anxious or stressed.Stress increases muscle tension and can worsen back pain.It is important to recognize when you are anxious or stressed and learn ways to manage it.Exercise is a great option.  SEEK MEDICAL CARE IF:  You have pain that is not relieved with rest or medicine.   You have pain that does not improve in 1 week.   You have new symptoms.   You are generally not feeling well.  SEEK IMMEDIATE MEDICAL CARE IF:   You have pain that radiates from your back into your legs.   You develop new bowel or bladder control problems.    You have unusual weakness or numbness in your arms or legs.   You develop nausea or vomiting.   You develop abdominal pain.   You feel faint.  Document Released: 05/09/2005 Document Revised: 04/28/2011 Document Reviewed: 09/27/2010 [facility] Patient Information 2012 [place], [place].    Back Exercises Back exercises help treat and prevent back injuries. The goal of back exercises is to increase the strength of your abdominal and back muscles and the flexibility of your back. These exercises should be started when you no longer have back pain. Back exercises include:  Pelvic Tilt. Lie on your back with your knees bent. Tilt your pelvis until the lower part of your back is against the floor. Hold this position 5 to 10 sec and repeat 5 to 10 times.   Knee to Chest. Pull first 1 knee up against your chest and hold for 20 to 30 seconds, repeat this with the other knee, and then both knees. This may be done with the other leg straight or bent, whichever feels better.   Sit-Ups or Curl-Ups. Bend your knees 90 degrees. Start with tilting your pelvis, and do a partial, slow sit-up, lifting your trunk only 30 to 45 degrees off the floor. Take at least 2 to 3 seconds for each sit-up. Do not do sit-ups with your knees out straight. If partial sit-ups are difficult, simply do the above but with only tightening your abdominal muscles and holding it as directed.   Hip-Lift. Lie on your back with your knees flexed 90 degrees. Push down with your feet and shoulders as you raise your hips a couple inches off the floor; hold for 10 seconds, repeat 5 to 10 times.   Back arches. Lie on your stomach, propping yourself up on bent elbows. Slowly press on your hands, causing an arch in your low back. Repeat 3 to 5 times. Any initial stiffness and discomfort should lessen with repetition over time.   Shoulder-Lifts. Lie face down with arms beside your body. Keep hips and torso pressed to floor as you slowly lift  your head and shoulders off the floor.  Do not overdo your exercises, especially in the beginning. Exercises may cause you some mild back discomfort which lasts for a few minutes; however, if the pain is more severe, or lasts for more than 15 minutes, do not continue exercises until you see your caregiver. Improvement with exercise therapy for back problems is slow.  See your caregivers for assistance with developing a proper back exercise program. Document Released: 06/16/2004 Document Revised: 04/28/2011 Document Reviewed: 05/09/2005 [facility] Patient Information 2012 [place], [place].    RESOURCE GUIDE  Dental Problems  Patients with Medicaid: [facility] [phone]  [address] Phone:  [phone]                                                  Phone:  [phone]  If unable to pay or uninsured, contact:  [facility] or [facility]. to become qualified for the adult dental clinic.  Chronic Pain Problems Contact [facility]  [phone] Patients need to be referred by their primary care doctor.  Insufficient Money for Medicine Contact [employer]:  call "211" or [facility] [phone].  No Primary Care Doctor Call [facility]  [phone] Other agencies that provide inexpensive medical care    [facility]  [phone]    [facility]  [phone]    [facility]  [phone]    [facility]  [phone]    [facility]  [phone]    [facility]  [phone]  Psychological Services [facility]  [phone] [facility]  [phone] [facility]   [phone] (emergency services [phone])  Substance Abuse Resources [facility]  613-285-3425 [facility] [phone] [place] [phone] Floydene Flock  [phone] Residential & Outpatient Substance Abuse Program  ([phone]  Abuse/Neglect [facility] Child Abuse Hotline 704-050-1132 [facility] Child Abuse Hotline [phone] (After Hours)  Emergency Shelter [facility] [phone]  Maternity Homes [facility] at the [place] [facility] [place] [phone] [facility] [phone]  MRSA Hotline #:   270-385-1761    [facility] Resources  [facility]     [employer]                          [facility] Dept. [address] [place]                       [address]      [address]  Blondell Reveal Phone:  [phone]                                   Phone:  [phone]                 Phone:  [phone]  [facility] Phone:  [phone]  [facility] Child Abuse Hotline 571-031-3262 [phone] (After Hours)

## 2011-08-06 NOTE — ED Notes (Signed)
Patient is alert and oriented x3.  He was given DC instructions and follow up visit instructions.  Patient gave verbal understanding.  He was DC ambulatory under his own power to home.  V/S stable.  He was not showing any signs of distress on DC 

## 2011-08-08 ENCOUNTER — Encounter: Payer: Self-pay | Admitting: Pulmonary Disease

## 2011-08-08 ENCOUNTER — Ambulatory Visit (INDEPENDENT_AMBULATORY_CARE_PROVIDER_SITE_OTHER): Payer: Medicare Other | Admitting: Pulmonary Disease

## 2011-08-08 VITALS — BP 122/72 | HR 94 | Temp 98.0°F | Ht 66.0 in | Wt 293.2 lb

## 2011-08-08 DIAGNOSIS — G4733 Obstructive sleep apnea (adult) (pediatric): Secondary | ICD-10-CM

## 2011-08-08 NOTE — Assessment & Plan Note (Signed)
The patient has a history of very mild obstructive sleep apnea, but feels that he is doing well with CPAP.  He does not wear every night because some nights he only gets a few hours of sleep, but I have asked him to wear it even on those nights.  I have also encouraged him to work aggressively on weight loss, and to continue keeping up with CPAP supplies.

## 2011-08-08 NOTE — Patient Instructions (Signed)
Try and wear cpap everynight. Keep up with mask changes and supplies. Work on weight loss If doing well, followup with me in one year.

## 2011-08-08 NOTE — Progress Notes (Signed)
  Subjective:    Patient ID: Alexander Mack, male    DOB: 08/17/1960, 51 y.o.   MRN: [id number]  HPI The patient comes in today for followup of his known obstructive sleep apnea.  He has been wearing CPAP 3-4 nights out of 7, and feels that it continues to help his sleep and daytime alertness.  He denies any issues with mask fit, leaks, or issues with pressure.  He is keeping up with mask changes and supplies.   Review of Systems  Constitutional: Negative for fever and unexpected weight change.  HENT: Negative for ear pain, nosebleeds, congestion, sore throat, rhinorrhea, sneezing, trouble swallowing, dental problem, postnasal drip and sinus pressure.   Eyes: Negative for redness and itching.  Respiratory: Negative for cough, chest tightness, shortness of breath and wheezing.   Cardiovascular: Negative for palpitations and leg swelling.  Gastrointestinal: Negative for nausea and vomiting.  Genitourinary: Negative for dysuria.  Musculoskeletal: Negative for joint swelling.  Skin: Negative for rash.  Neurological: Negative for headaches.  Hematological: Does not bruise/bleed easily.  Psychiatric/Behavioral: Positive for dysphoric mood. The patient is not nervous/anxious.        Objective:   Physical Exam Morbidly obese male in no acute distress No skin breakdown or pressure necrosis from the CPAP mask Lower extremities with mild edema, no cyanosis Alert, does not appear to be sleepy, moves all 4 extremities.       Assessment & Plan:

## 2011-08-12 NOTE — ED Provider Notes (Signed)
Medical screening examination/treatment/procedure(s) were performed by non-physician practitioner and as supervising physician I was immediately available for consultation/collaboration.  Rosemaria Inabinet, MD 08/12/11 1923 

## 2012-04-24 ENCOUNTER — Emergency Department (HOSPITAL_COMMUNITY): Payer: PRIVATE HEALTH INSURANCE

## 2012-04-24 ENCOUNTER — Emergency Department (HOSPITAL_COMMUNITY)
Admission: EM | Admit: 2012-04-24 | Discharge: 2012-04-24 | Disposition: A | Discharge status: Home / Self Care | Payer: PRIVATE HEALTH INSURANCE | Attending: Emergency Medicine | Admitting: Emergency Medicine

## 2012-04-24 DIAGNOSIS — Z8739 Personal history of other diseases of the musculoskeletal system and connective tissue: Secondary | ICD-10-CM | POA: Insufficient documentation

## 2012-04-24 DIAGNOSIS — F1910 Other psychoactive substance abuse, uncomplicated: Secondary | ICD-10-CM | POA: Insufficient documentation

## 2012-04-24 DIAGNOSIS — Z9889 Other specified postprocedural states: Secondary | ICD-10-CM | POA: Insufficient documentation

## 2012-04-24 DIAGNOSIS — Y939 Activity, unspecified: Secondary | ICD-10-CM | POA: Insufficient documentation

## 2012-04-24 DIAGNOSIS — I10 Essential (primary) hypertension: Secondary | ICD-10-CM | POA: Insufficient documentation

## 2012-04-24 DIAGNOSIS — Z87891 Personal history of nicotine dependence: Secondary | ICD-10-CM | POA: Insufficient documentation

## 2012-04-24 DIAGNOSIS — Z9119 Patient's noncompliance with other medical treatment and regimen: Secondary | ICD-10-CM | POA: Insufficient documentation

## 2012-04-24 DIAGNOSIS — E119 Type 2 diabetes mellitus without complications: Secondary | ICD-10-CM | POA: Insufficient documentation

## 2012-04-24 DIAGNOSIS — E785 Hyperlipidemia, unspecified: Secondary | ICD-10-CM | POA: Insufficient documentation

## 2012-04-24 DIAGNOSIS — E669 Obesity, unspecified: Secondary | ICD-10-CM | POA: Insufficient documentation

## 2012-04-24 DIAGNOSIS — F1021 Alcohol dependence, in remission: Secondary | ICD-10-CM | POA: Insufficient documentation

## 2012-04-24 DIAGNOSIS — Y92009 Unspecified place in unspecified non-institutional (private) residence as the place of occurrence of the external cause: Secondary | ICD-10-CM | POA: Insufficient documentation

## 2012-04-24 DIAGNOSIS — Z79899 Other long term (current) drug therapy: Secondary | ICD-10-CM | POA: Insufficient documentation

## 2012-04-24 DIAGNOSIS — I2510 Atherosclerotic heart disease of native coronary artery without angina pectoris: Secondary | ICD-10-CM | POA: Insufficient documentation

## 2012-04-24 DIAGNOSIS — Z91199 Patient's noncompliance with other medical treatment and regimen due to unspecified reason: Secondary | ICD-10-CM | POA: Insufficient documentation

## 2012-04-24 DIAGNOSIS — X58XXXA Exposure to other specified factors, initial encounter: Secondary | ICD-10-CM | POA: Insufficient documentation

## 2012-04-24 DIAGNOSIS — Z8719 Personal history of other diseases of the digestive system: Secondary | ICD-10-CM | POA: Insufficient documentation

## 2012-04-24 DIAGNOSIS — S60559A Superficial foreign body of unspecified hand, initial encounter: Secondary | ICD-10-CM | POA: Insufficient documentation

## 2012-04-24 MED ORDER — SULFAMETHOXAZOLE-TRIMETHOPRIM 800-160 MG PO TABS: 1.0000 | ORAL_TABLET | Freq: Two times a day (BID) | ORAL | Status: DC

## 2012-04-24 MED ORDER — LIDOCAINE-EPINEPHRINE 2 %-1:100000 IJ SOLN
INTRAMUSCULAR | Status: AC
  Filled 2012-04-24: qty 1

## 2012-04-24 NOTE — ED Notes (Signed)
Pt noticed black dot in palm of right hand. Pt states it is not painful just irritating. Pt has not tried to do anything at home to retrieve  foreign  Object. No drainage or edema noted.

## 2012-04-24 NOTE — ED Provider Notes (Signed)
Medical screening examination/treatment/procedure(s) were performed by non-physician practitioner and as supervising physician I was immediately available for consultation/collaboration.   Dione Booze, MD 04/24/12 [phone]

## 2012-04-24 NOTE — ED Provider Notes (Signed)
History     CSN: [id number]  Arrival date & time 04/24/12  1043   First MD Initiated Contact with Patient 04/24/12 1045      Chief Complaint  Patient presents with  . Hand Pain    (Consider location/radiation/quality/duration/timing/severity/associated sxs/prior treatment) HPI  Patient presents to the emergency department with complaints of blocked.polyp right hand. He states it is not painful it is irritating. 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 There's no edema or drainage noted. He is a diabetic and his last shot was 3 years ago. No acute distress vital signs are stable.  Past Medical History  Diagnosis Date  . CAD (coronary artery disease)   . Obese   . Dyslipidemia   . Diabetes mellitus   . History of arthroscopy   . 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 Plantar fascial fibromatosis   . Nonspecific abnormal results of liver function study   . Personal history of noncompliance with medical treatment, presenting hazards to health   . Unspecified essential hypertension   . Alcohol abuse   . Sexually transmitted disease   . Drug dependence     Past Surgical History  Procedure Date  . Appendectomy     Family History  Problem Relation Age of Onset  . Heart disease Mother   . Diabetes Mother     History  Substance Use Topics  . Smoking status: Former Smoker -- 1.0 packs/day for 11 years    Types: Cigarettes    Quit date: 05/24/1983  . Smokeless tobacco: Never Used  . Alcohol Use: No      Review of Systems   Review of Systems  Lungs:No wheezing, coughing or hemoptysis CV: no chest pain, palpitations, dependent edema or orthopnea  Abd: no abdominal pain, nausea, vomiting  GU: no dysuria or gross hematuria  MSK: right hand abnormality Neuro: no headache, no focal neurologic deficits  Skin: no abnormalities Psyche: negative.    Allergies  Review of patient's allergies indicates no known allergies.  Home Medications     Current Outpatient Rx  Name  Route  Sig  Dispense  Refill  . ATORVASTATIN CALCIUM 20 MG PO TABS   Oral   Take 20 mg by mouth daily.           . COD LIVER OIL PO   Oral   Take 1 capsule by mouth daily.         Marland Kitchen ESCITALOPRAM OXALATE 20 MG PO TABS   Oral   Take 20 mg by mouth daily.           Marland Kitchen GLIMEPIRIDE 4 MG PO TABS   Oral   Take 4 mg by mouth daily before breakfast.         . METFORMIN HCL 500 MG PO TABS   Oral   Take 1,000 mg by mouth 2 (two) times daily.          . COLON CLEANSER PO   Oral   Take 1 tablet by mouth daily.         Marland Kitchen NAPROXEN 500 MG PO TABS   Oral   Take 1 tablet (500 mg total) by mouth 2 (two) times daily.   30 tablet   0   . TRAZODONE HCL 100 MG PO TABS   Oral   Take 100 mg by mouth at bedtime.           Marland Kitchen VITAMIN E PO   Oral  Take 1 capsule by mouth daily.           BP 115/70  Pulse 83  Resp 14  SpO2 96%  Physical Exam  Musculoskeletal:       Hands:   ED Course  FOREIGN BODY REMOVAL Date/Time: 04/24/2012 12:49 PM Performed by: Dorthula Matas Authorized by: Dorthula Matas Consent: Verbal consent obtained. Consent given by: patient Intake: hand. Anesthesia: local infiltration Local anesthetic: lidocaine 2% with epinephrine Anesthetic total: 1 ml Post-procedure assessment: foreign body removed   (including critical care time)  Labs Reviewed - No data to display Dg Wrist Complete Right  04/24/2012  *RADIOLOGY REPORT*  Clinical Data: Trauma with concern for foreign body  RIGHT WRIST - COMPLETE 3+ VIEW  Comparison: None.  Findings: Frontal, oblique, lateral, and ulnar deviation scaphoid images were obtained. There is no fracture or dislocation.  There is a 2 mm long linear metallic foreign body in the soft tissues volar to the distal carpal row.  A paper clip was placed over an area along the skin which reportedly shows blistering; the metallic foreign body is at this level approximately 4 mm deep to the  skin surface.  Joint spaces appear intact.  No erosive change.  IMPRESSION: Small linear metallic foreign body in the superficial volar soft tissues as described.  No fracture or dislocation.  Joint spaces appear intact.   Original Report Authenticated By: Bretta Bang, M.D.      No diagnosis found.  Dx: foreign body to right hand  MDM  Piece of metal dirty and rusty. He does not know how long it was there, UTD on tetanus. Due to him being a diabetic, will give prophylactic abx.  Pt has been advised of the symptoms that warrant their return to the ED. Patient has voiced understanding and has agreed to follow-up with the PCP or specialist.         Dorthula Matas, PA 04/24/12 1250

## 2012-05-31 ENCOUNTER — Encounter: Payer: Self-pay | Admitting: Internal Medicine

## 2012-07-19 ENCOUNTER — Ambulatory Visit (INDEPENDENT_AMBULATORY_CARE_PROVIDER_SITE_OTHER): Payer: PRIVATE HEALTH INSURANCE | Admitting: Internal Medicine

## 2012-07-19 ENCOUNTER — Encounter: Payer: Self-pay | Admitting: Internal Medicine

## 2012-07-19 VITALS — BP 114/68 | HR 85 | Ht 66.0 in | Wt 288.0 lb

## 2012-07-19 DIAGNOSIS — I10 Essential (primary) hypertension: Secondary | ICD-10-CM

## 2012-07-19 DIAGNOSIS — E785 Hyperlipidemia, unspecified: Secondary | ICD-10-CM

## 2012-07-19 LAB — LIPID PANEL
Cholesterol: 141 mg/dL (ref 0–200)
LDL Cholesterol: 67 mg/dL (ref 0–99)

## 2012-07-19 NOTE — Progress Notes (Signed)
HPI [name] is a 52 year old. He has a history of very mild CAD by catheter in 2007. Myoview scan at that time showed no ischemia. (LAD showed a 30-40% distal narrowing. Circumflex had a 20-30% proximal narrowing. D2 had a 30 to 40% narrowing). I last the patient in July of last year. Lipids at that time were very good.  I saw him in clinic in December 2012 Since seen denies CP Partien tsays his  breathing is so-so.   Stable Admits to not being that active. No Known Allergies  Current Outpatient Prescriptions  Medication Sig Dispense Refill  . atorvastatin (LIPITOR) 20 MG tablet Take 20 mg by mouth daily.        . COD LIVER OIL PO Take 1 capsule by mouth daily.      . Escitalopram Oxalate (LEXAPRO PO) Take by mouth. Pt takes 30 mg daily      . glimepiride (AMARYL) 4 MG tablet Take 4 mg by mouth daily before breakfast.      . metFORMIN (GLUCOPHAGE) 1000 MG tablet Take 1,000 mg by mouth 2 (two) times daily with a meal.      . traZODone (DESYREL) 100 MG tablet Take 100 mg by mouth at bedtime.        Marland Kitchen VITAMIN E PO Take 1 capsule by mouth daily.       No current facility-administered medications for this visit.    Past Medical History  Diagnosis Date  . CAD (coronary artery disease)   . Obese   . Dyslipidemia   . Diabetes mellitus   . History of arthroscopy   . 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 Plantar fascial fibromatosis   . Nonspecific abnormal results of liver function study   . Personal history of noncompliance with medical treatment, presenting hazards to health   . Unspecified essential hypertension   . Alcohol abuse   . Sexually transmitted disease   . Drug dependence     Past Surgical History  Procedure Laterality Date  . Appendectomy      Family History  Problem Relation Age of Onset  . Heart disease Mother   . Diabetes Mother     History   Social History  . Marital Status: Single    Spouse Name: N/A    Number of  Children: N/A  . Years of Education: N/A   Occupational History  . Not on file.   Social History Main Topics  . Smoking status: Former Smoker -- 1.00 packs/day for 11 years    Types: Cigarettes    Quit date: 05/24/1983  . Smokeless tobacco: Never Used  . Alcohol Use: No  . Drug Use: No  . Sexually Active: Not on file   Other Topics Concern  . Not on file   Social History Narrative   Single. Lives with child. Disability     Review of Systems:  All systems reviewed.  They are negative to the above problem except as previously stated.  Vital Signs: BP 114/68  Pulse 85  Ht 5\' 6"  (1.676 m)  Wt 288 lb (130.636 kg)  BMI 46.51 kg/m2  Physical Exam Patient is in NAD HEENT:  Normocephalic, atraumatic. EOMI, PERRLA.  Neck: JVP is normal.  No bruits.  Lungs: clear to auscultation. No rales no wheezes.  Heart: Regular rate and rhythm. Normal S1, S2. No S3.   No significant murmurs. PMI not displaced.  Abdomen:  Supple, nontender. Normal bowel sounds. No masses. No hepatomegaly.  Extremities:   Good distal pulses throughout. No lower extremity edema.  Musculoskeletal :moving all extremities.  Neuro:   alert and oriented x3.  CN II-XII grossly intact.  EKG  SR 85 bpm.    Assessment and Plan:  1.  CAD  Mild  Asymptomatic  2  Dyspnea.  Stable COPD  3.  HL  Continue statin.

## 2012-07-19 NOTE — Patient Instructions (Addendum)
LABS TODAY:  LIPID & AST  Your physician wants you to follow-up in: 1 year with Dr. Tenny Craw.  You will receive a reminder letter in the mail two months in advance. If you don't receive a letter, please call our office to schedule the follow-up appointment.

## 2012-08-07 ENCOUNTER — Ambulatory Visit: Payer: Medicare Other | Admitting: Pulmonary Disease

## 2012-09-18 ENCOUNTER — Encounter: Payer: Self-pay | Admitting: Pulmonary Disease

## 2012-09-18 ENCOUNTER — Ambulatory Visit (INDEPENDENT_AMBULATORY_CARE_PROVIDER_SITE_OTHER): Payer: PRIVATE HEALTH INSURANCE | Admitting: Pulmonary Disease

## 2012-09-18 VITALS — BP 104/68 | HR 92 | Temp 96.7°F | Ht 66.0 in | Wt 293.4 lb

## 2012-09-18 DIAGNOSIS — G4733 Obstructive sleep apnea (adult) (pediatric): Secondary | ICD-10-CM

## 2012-09-18 NOTE — Progress Notes (Signed)
  Subjective:    Patient ID: Alexander Mack, male    DOB: July 16, 1960, 52 y.o.   MRN: [id number]  HPI The patient comes in today for followup of his obstructive sleep apnea.  He is not wearing CPAP compliantly, but is having issues with pressure tolerance.  He feels the pressure is too high at this point.  He is having no issues with his mask fit, and denies any problems with leaking.  He still is getting very little sleep at night, and I have asked him to work on his sleep hygiene.  Patient's weight is stable from the last visit a year ago.   Review of Systems  Constitutional: Negative for fever and unexpected weight change.  HENT: Negative for ear pain, nosebleeds, congestion, sore throat, rhinorrhea, sneezing, trouble swallowing, dental problem, postnasal drip and sinus pressure.   Eyes: Negative for redness and itching.  Respiratory: Negative for cough, chest tightness, shortness of breath and wheezing.   Cardiovascular: Negative for palpitations and leg swelling.  Gastrointestinal: Negative for nausea and vomiting.  Genitourinary: Negative for dysuria.  Musculoskeletal: Negative for joint swelling.       Numbness in right leg with standing too long  Skin: Negative for rash.  Neurological: Negative for headaches.  Hematological: Does not bruise/bleed easily.  Psychiatric/Behavioral: Positive for dysphoric mood. The patient is nervous/anxious.        Objective:   Physical Exam Obese male in no acute distress Nose without purulence or discharge noted No skin breakdown or pressure necrosis from the CPAP mask Neck without lymphadenopathy or thyromegaly Lower extremities with mild edema, no cyanosis Alert and oriented, moves all 4 extremities.       Assessment & Plan:

## 2012-09-18 NOTE — Patient Instructions (Addendum)
Will have your equipment company check your machine functioning. Will put your machine on the automatic mode for 2 weeks to recheck your pressure.  Will call you with results once I receive your download. Work on weight loss followup with me in one year if doing well.

## 2012-09-18 NOTE — Assessment & Plan Note (Signed)
The patient is not wearing CPAP as compliantly as I would like, but he is having issues with pressure.  He feels that it is blowing too hard, and we'll therefore recheck the functioning of his machine and also optimize his pressure again.  I have also encouraged him to work aggressively on weight loss.  If he is doing well, we'll see him back in one year.

## 2012-11-15 ENCOUNTER — Ambulatory Visit: Payer: PRIVATE HEALTH INSURANCE | Attending: Orthopedic Surgery | Admitting: Physical Therapy

## 2012-11-15 DIAGNOSIS — M545 Low back pain, unspecified: Secondary | ICD-10-CM | POA: Insufficient documentation

## 2012-11-15 DIAGNOSIS — G8929 Other chronic pain: Secondary | ICD-10-CM | POA: Insufficient documentation

## 2012-11-15 DIAGNOSIS — IMO0001 Reserved for inherently not codable concepts without codable children: Secondary | ICD-10-CM | POA: Insufficient documentation

## 2012-11-26 ENCOUNTER — Ambulatory Visit: Payer: PRIVATE HEALTH INSURANCE | Attending: Orthopedic Surgery | Admitting: Physical Therapy

## 2012-11-26 DIAGNOSIS — M545 Low back pain, unspecified: Secondary | ICD-10-CM | POA: Insufficient documentation

## 2012-11-26 DIAGNOSIS — R209 Unspecified disturbances of skin sensation: Secondary | ICD-10-CM | POA: Insufficient documentation

## 2012-11-26 DIAGNOSIS — IMO0001 Reserved for inherently not codable concepts without codable children: Secondary | ICD-10-CM | POA: Insufficient documentation

## 2012-11-29 ENCOUNTER — Ambulatory Visit: Payer: PRIVATE HEALTH INSURANCE | Admitting: Rehabilitation

## 2012-12-01 ENCOUNTER — Other Ambulatory Visit: Payer: Self-pay | Admitting: Pulmonary Disease

## 2012-12-01 DIAGNOSIS — G4733 Obstructive sleep apnea (adult) (pediatric): Secondary | ICD-10-CM

## 2012-12-03 ENCOUNTER — Ambulatory Visit: Payer: PRIVATE HEALTH INSURANCE | Admitting: Physical Therapy

## 2012-12-06 ENCOUNTER — Ambulatory Visit: Payer: PRIVATE HEALTH INSURANCE | Admitting: Rehabilitation

## 2012-12-11 ENCOUNTER — Ambulatory Visit: Payer: PRIVATE HEALTH INSURANCE | Admitting: Physical Therapy

## 2012-12-13 ENCOUNTER — Ambulatory Visit: Payer: PRIVATE HEALTH INSURANCE | Admitting: Rehabilitation

## 2012-12-18 ENCOUNTER — Ambulatory Visit: Payer: PRIVATE HEALTH INSURANCE | Admitting: Physical Therapy

## 2012-12-20 ENCOUNTER — Ambulatory Visit: Payer: PRIVATE HEALTH INSURANCE | Admitting: Rehabilitation

## 2012-12-24 ENCOUNTER — Ambulatory Visit: Payer: PRIVATE HEALTH INSURANCE | Attending: Orthopedic Surgery | Admitting: Physical Therapy

## 2012-12-24 DIAGNOSIS — IMO0001 Reserved for inherently not codable concepts without codable children: Secondary | ICD-10-CM | POA: Insufficient documentation

## 2012-12-24 DIAGNOSIS — M545 Low back pain, unspecified: Secondary | ICD-10-CM | POA: Insufficient documentation

## 2012-12-24 DIAGNOSIS — G8929 Other chronic pain: Secondary | ICD-10-CM | POA: Insufficient documentation

## 2012-12-26 ENCOUNTER — Ambulatory Visit: Payer: PRIVATE HEALTH INSURANCE | Admitting: Physical Therapy

## 2013-01-16 ENCOUNTER — Observation Stay (HOSPITAL_COMMUNITY)
Admission: EM | Admit: 2013-01-16 | Discharge: 2013-01-17 | Disposition: A | Discharge status: Home / Self Care | Payer: PRIVATE HEALTH INSURANCE | Attending: Internal Medicine | Admitting: Internal Medicine

## 2013-01-16 ENCOUNTER — Emergency Department (HOSPITAL_COMMUNITY): Payer: PRIVATE HEALTH INSURANCE

## 2013-01-16 ENCOUNTER — Encounter (HOSPITAL_COMMUNITY): Payer: Self-pay | Admitting: *Deleted

## 2013-01-16 DIAGNOSIS — F1010 Alcohol abuse, uncomplicated: Secondary | ICD-10-CM

## 2013-01-16 DIAGNOSIS — M722 Plantar fascial fibromatosis: Secondary | ICD-10-CM

## 2013-01-16 DIAGNOSIS — R079 Chest pain, unspecified: Secondary | ICD-10-CM

## 2013-01-16 DIAGNOSIS — I209 Angina pectoris, unspecified: Principal | ICD-10-CM | POA: Insufficient documentation

## 2013-01-16 DIAGNOSIS — M5126 Other intervertebral disc displacement, lumbar region: Secondary | ICD-10-CM

## 2013-01-16 DIAGNOSIS — R945 Abnormal results of liver function studies: Secondary | ICD-10-CM

## 2013-01-16 DIAGNOSIS — F1920 Other psychoactive substance dependence, uncomplicated: Secondary | ICD-10-CM

## 2013-01-16 DIAGNOSIS — Z9119 Patient's noncompliance with other medical treatment and regimen: Secondary | ICD-10-CM | POA: Insufficient documentation

## 2013-01-16 DIAGNOSIS — E119 Type 2 diabetes mellitus without complications: Secondary | ICD-10-CM

## 2013-01-16 DIAGNOSIS — E669 Obesity, unspecified: Secondary | ICD-10-CM

## 2013-01-16 DIAGNOSIS — I2510 Atherosclerotic heart disease of native coronary artery without angina pectoris: Secondary | ICD-10-CM

## 2013-01-16 DIAGNOSIS — E785 Hyperlipidemia, unspecified: Secondary | ICD-10-CM

## 2013-01-16 DIAGNOSIS — Z91013 Allergy to seafood: Secondary | ICD-10-CM | POA: Insufficient documentation

## 2013-01-16 DIAGNOSIS — G4733 Obstructive sleep apnea (adult) (pediatric): Secondary | ICD-10-CM

## 2013-01-16 DIAGNOSIS — I2089 Other forms of angina pectoris: Secondary | ICD-10-CM

## 2013-01-16 DIAGNOSIS — R0789 Other chest pain: Secondary | ICD-10-CM | POA: Insufficient documentation

## 2013-01-16 DIAGNOSIS — I10 Essential (primary) hypertension: Secondary | ICD-10-CM

## 2013-01-16 DIAGNOSIS — I208 Other forms of angina pectoris: Secondary | ICD-10-CM

## 2013-01-16 DIAGNOSIS — Z981 Arthrodesis status: Secondary | ICD-10-CM

## 2013-01-16 DIAGNOSIS — A64 Unspecified sexually transmitted disease: Secondary | ICD-10-CM

## 2013-01-16 DIAGNOSIS — Z91199 Patient's noncompliance with other medical treatment and regimen due to unspecified reason: Secondary | ICD-10-CM

## 2013-01-16 HISTORY — DX: Anxiety disorder, unspecified: F41.9

## 2013-01-16 HISTORY — DX: Depression, unspecified: F32.A

## 2013-01-16 HISTORY — DX: Other chronic pain: G89.29

## 2013-01-16 HISTORY — DX: Major depressive disorder, single episode, unspecified: F32.9

## 2013-01-16 LAB — COMPREHENSIVE METABOLIC PANEL
ALT: 19 U/L (ref 0–53)
AST: 16 U/L (ref 0–37)
Alkaline Phosphatase: 91 U/L (ref 39–117)
CO2: 26 mEq/L (ref 19–32)
Calcium: 8.8 mg/dL (ref 8.4–10.5)
Chloride: 100 mEq/L (ref 96–112)
GFR calc Af Amer: >90 mL/min (ref >90)
GFR calc non Af Amer: >90 mL/min (ref >90)
Glucose, Bld: 124 mg/dL — ABNORMAL HIGH (ref 70–99)
Potassium: 3.5 mEq/L (ref 3.5–5.1)
Sodium: 135 mEq/L (ref 135–145)
Total Bilirubin: 0.2 mg/dL — ABNORMAL LOW (ref 0.3–1.2)

## 2013-01-16 LAB — CBC
Hemoglobin: 12.5 g/dL — ABNORMAL LOW (ref 13.0–17.0)
MCH: 27.3 pg (ref 26.0–34.0)
Platelets: 261 10*3/uL (ref 150–400)
RBC: 4.58 MIL/uL (ref 4.22–5.81)
WBC: 8.1 10*3/uL (ref 4.0–10.5)

## 2013-01-16 MED ORDER — SODIUM CHLORIDE 0.9 % IV SOLN: 1000.0000 mL | INTRAVENOUS | Status: DC

## 2013-01-16 MED ORDER — ASPIRIN 81 MG PO CHEW
324.0000 mg | CHEWABLE_TABLET | Freq: Once | ORAL | Status: AC
Start: 2013-01-16 — End: 2013-01-16
  Administered 2013-01-16: 324 mg via ORAL
  Filled 2013-01-16: qty 4

## 2013-01-16 MED ORDER — NITROGLYCERIN 0.4 MG SL SUBL
0.4000 mg | SUBLINGUAL_TABLET | SUBLINGUAL | Status: DC | PRN
  Administered 2013-01-16: 0.4 mg via SUBLINGUAL
  Filled 2013-01-16: qty 25

## 2013-01-16 NOTE — ED Notes (Signed)
Patient is alert and oriented x3.  He is complaining of upset stomach accompanied with left chest pain that  Started today.  Patient states that he does have a history of this issue.  Currently he rates his pain 8 of 10 That is a burning on the left side.

## 2013-01-16 NOTE — ED Provider Notes (Signed)
CSN: [id number]     Arrival date & time 01/16/13  2125 History   First MD Initiated Contact with Patient 01/16/13 2146     Chief Complaint  Patient presents with  . Chest Pain  . GI Problem    upset stomach   (Consider location/radiation/quality/duration/timing/severity/associated sxs/prior Treatment) HPI  Alexander Mack is a pleasant 52 y.o. male who drove himself to the emergency room on his motorcycle complaining of dull left-sided chest pain radiating to left arm onset at noon today while patient was resting. Pain at its worst is 8/10, is a 6/10 right now. Patient not had any aspirin today, he normally takes a baby aspirin daily.. It is associated with the dyspepsia and nausea, and diaphoresis. Patient denies fever, cough, shortness of breath, back pain, syncope, or prior similar episodes, recent cocaine or methamphetamine abuse, history of DVT or PE, leg pain or swelling. Patient was seen by [facility] cardiology in 2012 for evaluation of atypical chest pain. He had a normal stress test at that time, cath in 2007 reveals nonobstructive CAD with 40% left anterior descending disease.  RF: CAD Non-insulin-dependent diabetes, high cholesterol, Cath: 2007 nonobstructive CAD with 40% left anterior descending disease, Circumflex had a 20-30% proximal narrowing. D2 had a 30 to 40% narrowing Last Stress test: 2012  Cardiologost: Briant Cedar  PCP: Concepcion Elk  Past Medical History  Diagnosis Date  . CAD (coronary artery disease)   . Obese   . Dyslipidemia   . Diabetes mellitus   . History of arthroscopy   . 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 Plantar fascial fibromatosis   . Nonspecific abnormal results of liver function study   . Personal history of noncompliance with medical treatment, presenting hazards to health   . Unspecified essential hypertension   . Alcohol abuse   . Sexually transmitted disease   . Drug dependence    Past Surgical History    Procedure Laterality Date  . Appendectomy     Family History  Problem Relation Age of Onset  . Heart disease Mother   . Diabetes Mother    History  Substance Use Topics  . Smoking status: Former Smoker -- 1.00 packs/day for 11 years    Types: Cigarettes    Quit date: 05/24/1983  . Smokeless tobacco: Never Used  . Alcohol Use: No    Review of Systems 10 systems reviewed and found to be negative, except as noted in the HPI  Allergies  Shellfish allergy  Home Medications   Current Outpatient Rx  Name  Route  Sig  Dispense  Refill  . atorvastatin (LIPITOR) 20 MG tablet   Oral   Take 20 mg by mouth daily.           . Escitalopram Oxalate (LEXAPRO PO)   Oral   Take by mouth. Pt takes 30 mg daily         . glimepiride (AMARYL) 4 MG tablet   Oral   Take 4 mg by mouth daily before breakfast.         . metFORMIN (GLUCOPHAGE) 1000 MG tablet   Oral   Take 1,000 mg by mouth 2 (two) times daily with a meal.         . traZODone (DESYREL) 100 MG tablet   Oral   Take 100 mg by mouth at bedtime.           Marland Kitchen VITAMIN E PO   Oral   Take 1 capsule by  mouth daily.          BP 122/68  Pulse 86  Temp(Src) 97.7 F (36.5 C) (Oral)  Resp 26  SpO2 98% Physical Exam  Nursing note and vitals reviewed. Constitutional: He is oriented to person, place, and time. He appears well-developed and well-nourished. No distress.  Obese  HENT:  Head: Normocephalic.  Mouth/Throat: Oropharynx is clear and moist.  Eyes: Conjunctivae and EOM are normal.  Cardiovascular: Normal rate, regular rhythm and intact distal pulses.   Pulmonary/Chest: Effort normal and breath sounds normal. No stridor. No respiratory distress. He has no wheezes. He has no rales. He exhibits tenderness.    Abdominal: Soft. Bowel sounds are normal. He exhibits no distension and no mass. There is no tenderness. There is no rebound and no guarding.  Musculoskeletal: Normal range of motion. He exhibits no edema  and no tenderness.  Neurological: He is alert and oriented to person, place, and time.  Psychiatric: He has a normal mood and affect.    ED Course  Procedures (including critical care time) Labs Review Labs Reviewed  GLUCOSE, CAPILLARY - Abnormal; Notable for the following:    Glucose-Capillary 132 (*)    All other components within normal limits  CBC - Abnormal; Notable for the following:    Hemoglobin 12.5 (*)    HCT 37.8 (*)    All other components within normal limits  COMPREHENSIVE METABOLIC PANEL - Abnormal; Notable for the following:    Glucose, Bld 124 (*)    Albumin 3.2 (*)    Total Bilirubin 0.2 (*)    All other components within normal limits  POCT I-STAT TROPONIN I   Imaging Review Dg Chest 2 View  01/16/2013   *RADIOLOGY REPORT*  Clinical Data: Chest pain  CHEST - 2 VIEW  Comparison: 02/18/2011  Findings: The cardiac shadow is stable.  The lungs are clear bilaterally.  No acute bony abnormality is seen.  IMPRESSION: No acute abnormality noted.   Original Report Authenticated By: Alcide Clever, M.D.     Date: 01/16/2013  Rate: 78  Rhythm: normal sinus rhythm and premature ventricular contractions (PVC)  QRS Axis: normal  Intervals: normal  ST/T Wave abnormalities: normal  Conduction Disutrbances:none  Narrative Interpretation:   Old EKG Reviewed: New PVCs  23:45 patient is resting comfortably asleep in his room, pain is fully resolved after 2 sublingual nitroglycerin tablets. Patient still throwing PVCs on the monitor.  MDM   1. Chest pain     Filed Vitals:   01/16/13 2128 01/16/13 2146  BP: 132/85 122/68  Pulse: 87 86  Temp: 97.8 F (36.6 C) 97.7 F (36.5 C)  TempSrc: Oral Oral  Resp: 22 26  SpO2: 99% 98%     Alexander Mack is a 52 y.o. male with nonobstructive coronary artery disease by cath in 2007. Story is very concerning for ACS.  EKG is nonischemic, troponin is negative, patient is not anemic and CMP shows no abnormalities. Patient has  no infiltrate on chest x-ray.  Patient will be admitted under the care of Dr. Malachi Bonds.    Medications  0.9 %  sodium chloride infusion (not administered)  nitroGLYCERIN (NITROSTAT) SL tablet 0.4 mg (0.4 mg Sublingual Given 01/16/13 2233)  aspirin chewable tablet 324 mg (324 mg Oral Given 01/16/13 2230)   Note: Portions of this report may have been transcribed using voice recognition software. Every effort was made to ensure accuracy; however, inadvertent computerized transcription errors may be present      Joni Reining  Penda Venturi, PA-C 01/17/13 1610

## 2013-01-17 ENCOUNTER — Encounter (HOSPITAL_COMMUNITY): Payer: Self-pay | Admitting: Internal Medicine

## 2013-01-17 DIAGNOSIS — I208 Other forms of angina pectoris: Secondary | ICD-10-CM

## 2013-01-17 DIAGNOSIS — F1010 Alcohol abuse, uncomplicated: Secondary | ICD-10-CM

## 2013-01-17 DIAGNOSIS — I2089 Other forms of angina pectoris: Secondary | ICD-10-CM

## 2013-01-17 DIAGNOSIS — R079 Chest pain, unspecified: Secondary | ICD-10-CM

## 2013-01-17 DIAGNOSIS — E119 Type 2 diabetes mellitus without complications: Secondary | ICD-10-CM

## 2013-01-17 DIAGNOSIS — I2510 Atherosclerotic heart disease of native coronary artery without angina pectoris: Secondary | ICD-10-CM

## 2013-01-17 DIAGNOSIS — E669 Obesity, unspecified: Secondary | ICD-10-CM

## 2013-01-17 DIAGNOSIS — I209 Angina pectoris, unspecified: Secondary | ICD-10-CM

## 2013-01-17 DIAGNOSIS — G4733 Obstructive sleep apnea (adult) (pediatric): Secondary | ICD-10-CM

## 2013-01-17 DIAGNOSIS — I10 Essential (primary) hypertension: Secondary | ICD-10-CM

## 2013-01-17 LAB — CBC
Hemoglobin: 12.3 g/dL — ABNORMAL LOW (ref 13.0–17.0)
MCH: 27.2 pg (ref 26.0–34.0)
MCHC: 33.1 g/dL (ref 30.0–36.0)
MCV: 82.3 fL (ref 78.0–100.0)
RBC: 4.52 MIL/uL (ref 4.22–5.81)

## 2013-01-17 LAB — BASIC METABOLIC PANEL
BUN: 7 mg/dL (ref 6–23)
CO2: 27 mEq/L (ref 19–32)
Calcium: 8.8 mg/dL (ref 8.4–10.5)
Creatinine, Ser: 0.83 mg/dL (ref 0.50–1.35)
GFR calc non Af Amer: >90 mL/min (ref >90)
Glucose, Bld: 79 mg/dL (ref 70–99)

## 2013-01-17 LAB — LIPID PANEL
Cholesterol: 133 mg/dL (ref 0–200)
HDL: 59 mg/dL (ref >39)
LDL Cholesterol: 65 mg/dL (ref 0–99)
Triglycerides: 45 mg/dL (ref <150)

## 2013-01-17 LAB — RAPID URINE DRUG SCREEN, HOSP PERFORMED
Amphetamines: NOT DETECTED
Barbiturates: NOT DETECTED
Tetrahydrocannabinol: NOT DETECTED

## 2013-01-17 LAB — HEMOGLOBIN A1C: Hgb A1c MFr Bld: 7.1 % — ABNORMAL HIGH (ref <5.7)

## 2013-01-17 LAB — TROPONIN I
Troponin I: <0.3 ng/mL (ref <0.30)
Troponin I: <0.3 ng/mL (ref <0.30)

## 2013-01-17 MED ORDER — ASPIRIN 81 MG PO CHEW
81.0000 mg | CHEWABLE_TABLET | Freq: Every day | ORAL | Status: DC
  Administered 2013-01-17: 81 mg via ORAL
  Filled 2013-01-17 (×2): qty 1

## 2013-01-17 MED ORDER — PANTOPRAZOLE SODIUM 40 MG PO TBEC: 40.0000 mg | DELAYED_RELEASE_TABLET | Freq: Every day | ORAL | Status: DC

## 2013-01-17 MED ORDER — HYDROMORPHONE HCL PF 1 MG/ML IJ SOLN: 1.0000 mg | INTRAMUSCULAR | Status: DC | PRN

## 2013-01-17 MED ORDER — ONDANSETRON HCL 4 MG/2ML IJ SOLN: 4.0000 mg | Freq: Three times a day (TID) | INTRAMUSCULAR | Status: AC | PRN

## 2013-01-17 MED ORDER — CARVEDILOL 6.25 MG PO TABS
6.2500 mg | ORAL_TABLET | Freq: Two times a day (BID) | ORAL | Status: DC
  Administered 2013-01-17: 6.25 mg via ORAL
  Filled 2013-01-17 (×3): qty 1

## 2013-01-17 MED ORDER — SODIUM CHLORIDE 0.9 % IV SOLN
1000.0000 mL | INTRAVENOUS | Status: DC
  Administered 2013-01-17: 1000 mL via INTRAVENOUS

## 2013-01-17 MED ORDER — DEXTROSE-NACL 5-0.9 % IV SOLN
INTRAVENOUS | Status: DC
  Administered 2013-01-17: 10:00:00 via INTRAVENOUS

## 2013-01-17 MED ORDER — ALUM & MAG HYDROXIDE-SIMETH 200-200-20 MG/5ML PO SUSP: 15.0000 mL | ORAL | Status: DC | PRN

## 2013-01-17 MED ORDER — ENOXAPARIN SODIUM 40 MG/0.4ML ~~LOC~~ SOLN
40.0000 mg | SUBCUTANEOUS | Status: DC
  Administered 2013-01-17: 40 mg via SUBCUTANEOUS
  Filled 2013-01-17: qty 0.4

## 2013-01-17 MED ORDER — TRAZODONE HCL 100 MG PO TABS
100.0000 mg | ORAL_TABLET | Freq: Every day | ORAL | Status: DC
  Filled 2013-01-17: qty 1

## 2013-01-17 MED ORDER — ALBUTEROL SULFATE (5 MG/ML) 0.5% IN NEBU: 2.5000 mg | INHALATION_SOLUTION | Freq: Four times a day (QID) | RESPIRATORY_TRACT | Status: AC | PRN

## 2013-01-17 MED ORDER — PANTOPRAZOLE SODIUM 40 MG PO TBEC: 40.0000 mg | DELAYED_RELEASE_TABLET | Freq: Two times a day (BID) | ORAL | Status: DC

## 2013-01-17 MED ORDER — ATORVASTATIN CALCIUM 80 MG PO TABS
80.0000 mg | ORAL_TABLET | Freq: Every day | ORAL | Status: DC
  Filled 2013-01-17: qty 1

## 2013-01-17 MED ORDER — HYDROMORPHONE HCL PF 1 MG/ML IJ SOLN: 0.5000 mg | INTRAMUSCULAR | Status: AC | PRN

## 2013-01-17 MED ORDER — NITROGLYCERIN 0.4 MG SL SUBL: 0.4000 mg | SUBLINGUAL_TABLET | SUBLINGUAL | Status: DC | PRN

## 2013-01-17 MED ORDER — ACETAMINOPHEN 325 MG PO TABS: 650.0000 mg | ORAL_TABLET | ORAL | Status: DC | PRN

## 2013-01-17 MED ORDER — ESCITALOPRAM OXALATE 10 MG PO TABS
30.0000 mg | ORAL_TABLET | Freq: Every day | ORAL | Status: DC
  Administered 2013-01-17: 30 mg via ORAL
  Filled 2013-01-17: qty 1

## 2013-01-17 MED ORDER — PANTOPRAZOLE SODIUM 40 MG IV SOLR
40.0000 mg | Freq: Two times a day (BID) | INTRAVENOUS | Status: DC
  Administered 2013-01-17 (×2): 40 mg via INTRAVENOUS
  Filled 2013-01-17 (×3): qty 40

## 2013-01-17 NOTE — Progress Notes (Signed)
Pt. Seen after transfer from [facility].  Patient was seen by Dr. Malachi Bonds few hours earlier.  52 y.o. year-old male with history of hypertension,T2DM, hyperlipidemia,obesity, OSA noncompliant with CPAP, previous tobacco abuse, and nonobstructive CAD via 2007 cath (LAD showed a 30-40% distal narrowing. Circumflex had a 20-30% proximal narrowing. D2 had a 30 to 40% narrowing) followed by Dr. Tenny Craw,, but has not seen her for over one year. The patient had acute onset of left-sided chest discomfort around noontime on 01/16/2013. This is associated with some shortness of breath, nausea, and diaphoresis. As a result, the patient went to the [facility] ED for further evaluation.in the ED, the patient's chest discomfort was relieved with SL NTG x 2.EKG showed nonspecific T-wave inversion in III.  Chest x-ray was negative anti-stat troponin was negative.  The patient was transferred to [facility] for cardiology eval and need for myoview vs cath.the patient has been noncompliant with his CPAP, and has not been taking his aspirin.   VS98.1-HR 64-RR20- 121/83--100% RA CV--RRR, no rub Lung--CTA Abd--soft/nt, +BS EXT--trace edema, no rash or lymphangitis  Atypical Chest pain -in setting of multiple cardiac risk factors and hx of nonobstructive CAD -consult pt's established cardiologist for possible myoview -cycle troponins -NPO -currently pain free -continue statin -ASA  DM2 -A1c, lipid panel  NCNC Anemia -workup as ordered by Dr. Malachi Bonds  DTat

## 2013-01-17 NOTE — Discharge Summary (Signed)
Physician Discharge Summary  Alexander Mack ZOX:[id number] DOB: 11-01-1960 DOA: 01/16/2013  PCP: [name], MD  Admit date: 01/16/2013 Discharge date: 01/17/2013  Time spent: 25  minutes  Recommendations for Outpatient Follow-up:  Cardiology office;Dr. Tenny Craw on 02/04/2013  Discharge Diagnoses:  Principal Problem:   Angina at rest Active Problems:   DM   HYPERLIPIDEMIA-MIXED   Obesity, unspecified   OBSTRUCTIVE SLEEP APNEA   HYPERTENSION, ESSENTIAL NOS   CAD, NATIVE VESSEL   Discharge Condition: stable   Diet recommendation: heart healthy   Filed Weights   01/16/13 2234 01/17/13 0306  Weight: 127.007 kg (280 lb) 131.815 kg (290 lb 9.6 oz)    History of present illness:  52 y.o. year-old male with history of hypertension,T2DM, hyperlipidemia,obesity, OSA noncompliant with CPAP, previous tobacco abuse, and nonobstructive CAD via 2007 cath (LAD showed a 30-40% distal narrowing. Circumflex had a 20-30% proximal narrowing. D2 had a 30 to 40% narrowing) followed by Dr. Tenny Craw,, but has not seen her for over one year. The patient had acute onset of left-sided chest discomfort around noontime on 01/16/2013. This is associated with some shortness of breath, nausea, and diaphoresis. As a result, the patient went to the [facility] ED for further evaluation.in the ED, the patient's chest discomfort was relieved with SL NTG x 2.EKG showed nonspecific T-wave inversion in III. Chest x-ray was negative anti-stat troponin was negative.   Hospital Course:   1. Chest pain atypical but multiple risk factors with h/o non obstructive CAD;  -h/o CAD via 2007 cath (LAD showed a 30-40% distal narrowing. Circumflex had a 20-30% proximal narrowing. D2 had a 30 to 40% narrowing) followed by Dr. Tenny Craw  -initial ECG, trop unremarkable; chest pain resolved; -patient was evaluated by cardiologist who recommended outpatyient follow up with possible stress test; f/u scheduled 02/04/13 2. T2DM, resume PO meds;  outpatient f/u with [facility] 4. OSA, noncompliant  - Counseled importance of compliance and ordered CPAP  5. Obesity, would likely help with chronic joint pains  - Counseled weight loss     Procedures:  None  (i.e. Studies not automatically included, echos, thoracentesis, etc; not x-rays)  Consultations:  Dr. [place]   Discharge Exam: Filed Vitals:   01/17/13 0306  BP: 121/83  Pulse: 64  Temp: 97.4 F (36.3 C)  Resp: 20    General: alert  Cardiovascular: s1s2 rrr Respiratory: cta BL   Discharge Instructions  Discharge Orders   Future Appointments Provider Department Dept Phone   02/04/2013 12:10 PM Beatrice Lecher, PA-C [facility] Main [facility] [place]) [phone]   09/18/2013 9:00 AM Barbaraann Share, MD  [facility] [phone]   Future Orders Complete By Expires   Diet - low sodium heart healthy  As directed    Discharge instructions  As directed    Comments:     Follow up with primary care doctor in 1-2 weeks   Increase activity slowly  As directed        Medication List         aspirin 81 MG tablet  Take 81 mg by mouth daily.     escitalopram 10 MG tablet  Commonly known as:  LEXAPRO  Take 30 mg by mouth daily.     glimepiride 4 MG tablet  Commonly known as:  AMARYL  Take 4 mg by mouth daily before breakfast.     LIPITOR 20 MG tablet  Generic drug:  atorvastatin  Take 20 mg by mouth daily.     metFORMIN 1000  MG tablet  Commonly known as:  GLUCOPHAGE  Take 1,000 mg by mouth 2 (two) times daily with a meal.     pantoprazole 40 MG tablet  Commonly known as:  PROTONIX  Take 1 tablet (40 mg total) by mouth daily.     traZODone 100 MG tablet  Commonly known as:  DESYREL  Take 100 mg by mouth at bedtime.     VITAMIN E PO  Take 1 capsule by mouth daily.       Allergies  Allergen Reactions  . Shellfish Allergy Shortness Of Breath, Nausea Only and Palpitations       Follow-up Information   Follow up with Tereso Newcomer, PA-C On  02/04/2013. (12:10 PM - Dr. Tenny Craw' PA)    Specialty:  Physician Assistant   Contact information:   [address] [place] [address] [phone]        The results of significant diagnostics from this hospitalization (including imaging, microbiology, ancillary and laboratory) are listed below for reference.    Significant Diagnostic Studies: Dg Chest 2 View  01/16/2013   *RADIOLOGY REPORT*  Clinical Data: Chest pain  CHEST - 2 VIEW  Comparison: 02/18/2011  Findings: The cardiac shadow is stable.  The lungs are clear bilaterally.  No acute bony abnormality is seen.  IMPRESSION: No acute abnormality noted.   Original Report Authenticated By: Alcide Clever, M.D.    Microbiology: No results found for this or any previous visit (from the past 240 hour(s)).   Labs: Basic Metabolic Panel:  Recent Labs Lab 01/16/13 2245 01/17/13 0400  NA 135 141  K 3.5 4.0  CL 100 106  CO2 26 27  GLUCOSE 124* 79  BUN 7 7  CREATININE 0.81 0.83  CALCIUM 8.8 8.8   Liver Function Tests:  Recent Labs Lab 01/16/13 2245  AST 16  ALT 19  ALKPHOS 91  BILITOT 0.2*  PROT 6.7  ALBUMIN 3.2*   No results found for this basename: LIPASE, AMYLASE,  in the last 168 hours No results found for this basename: AMMONIA,  in the last 168 hours CBC:  Recent Labs Lab 01/16/13 2245 01/17/13 0400  WBC 8.1 7.3  HGB 12.5* 12.3*  HCT 37.8* 37.2*  MCV 82.5 82.3  PLT 261 244   Cardiac Enzymes:  Recent Labs Lab 01/17/13 0314 01/17/13 1035  TROPONINI <0.30 <0.30   BNP: BNP (last 3 results)  Recent Labs  01/17/13 0314  PROBNP 17.6   CBG:  Recent Labs Lab 01/16/13 2155 01/17/13 0746 01/17/13 1120  GLUCAP 132* 66* 64*       Signed:  Asli Tokarski N  [facility] 01/17/2013, 1:40 PM

## 2013-01-17 NOTE — ED Notes (Signed)
[id number] ASSIGNED@0046 

## 2013-01-17 NOTE — H&P (Addendum)
[name]  Alexander Mack [name]:[id number] DOB: 08-21-1960 DOA: 01/16/2013  Referring physician:  [name] PCP:  [name], MD   Chief Complaint:  Chest pain  HPI:  The patient is a 52 y.o. year-old male with history of T2DM, obesity, OSA noncompliant with CPAP, previous tobacco abuse, and nonobstructive CAD via 2007 cath (LAD showed a 30-40% distal narrowing. Circumflex had a 20-30% proximal narrowing. D2 had a 30 to 40% narrowing) followed by Dr. Tenny Craw and then lost to follow up.  Has been chest pain free since his catheterization in 2007, but developed acute onset sharp 7/10 CP at rest today, not associated with SOB, but was "severe" per patient and associate with clamminess, diaphoresis, and nausea.  No radiation of pain.  He had some relief of symptoms with repositioning, but had full relief of symptoms with NTG x 2 in the ER.    In the ER, ECG with NSR, frequent PVC on telemetry, isolated T-wave inversion in lead 3, no ST-segment elevation or depression.  POC troponin is negative.  CXR without acute disease.    Review of Systems:  General:  Denies fevers, chills, weight loss or gain HEENT:  Denies changes to hearing and vision, rhinorrhea, sinus congestion, sore throat CV:  Denies orthopnea, PND, palpitations, lower extremity edema.  PULM:  Denies SOB, wheezing, cough.   GI:  Denies nausea, vomiting, constipation, diarrhea.   GU:  Denies dysuria, frequency, urgency ENDO:  Denies polyuria, polydipsia.   HEME:  Denies hematemesis, blood in stools, melena, abnormal bruising or bleeding.  LYMPH:  Denies lymphadenopathy.   MSK:  Denies arthralgias, myalgias.   DERM:  Denies skin rash or ulcer.   NEURO:  Denies focal numbness, weakness, slurred speech, confusion, facial droop.  PSYCH:  Denies anxiety and depression.    Past Medical History  Diagnosis Date  . CAD (coronary artery disease)     2007 cath:  40% LAD nonobstructive   . Obese   .  Dyslipidemia   . Diabetes mellitus   . History of arthroscopy   . 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 Plantar fascial fibromatosis   . Nonspecific abnormal results of liver function study   . Personal history of noncompliance with medical treatment, presenting hazards to health   . Unspecified essential hypertension   . Alcohol abuse   . Sexually transmitted disease   . Drug dependence   . Anxiety and depression   . Chronic pain     due to slipped disk, arthritis   Past Surgical History  Procedure Laterality Date  . Appendectomy    . Cardiac catheterization  2007   Social History:  reports that he quit smoking about 29 years ago. His smoking use included Cigarettes. He has a 11 pack-year smoking history. He has never used smokeless tobacco. He reports that he does not drink alcohol or use illicit drugs.   Allergies  Allergen Reactions  . Shellfish Allergy Shortness Of Breath, Nausea Only and Palpitations    Family History  Problem Relation Age of Onset  . Heart disease Mother 3  . Diabetes Mother   . Diabetes Brother      Prior to Admission medications   Medication Sig Start Date End Date Taking? Authorizing Provider  aspirin 81 MG tablet Take 81 mg by mouth daily.   Yes Historical Provider, MD  atorvastatin (LIPITOR) 20 MG tablet Take 20 mg by mouth daily.     Yes Historical Provider,  MD  escitalopram (LEXAPRO) 10 MG tablet Take 30 mg by mouth daily.   Yes Historical Provider, MD  glimepiride (AMARYL) 4 MG tablet Take 4 mg by mouth daily before breakfast.   Yes Historical Provider, MD  metFORMIN (GLUCOPHAGE) 1000 MG tablet Take 1,000 mg by mouth 2 (two) times daily with a meal.   Yes Historical Provider, MD  traZODone (DESYREL) 100 MG tablet Take 100 mg by mouth at bedtime.     Yes Historical Provider, MD  VITAMIN E PO Take 1 capsule by mouth daily.   Yes Historical Provider, MD   Physical Exam: Filed Vitals:   01/16/13 2128 01/16/13 2146  01/16/13 2234 01/17/13 0038  BP: 132/85 122/68  138/82  Pulse: 87 86    Temp: 97.8 F (36.6 C) 97.7 F (36.5 C)  98.1 F (36.7 C)  TempSrc: Oral Oral  Oral  Resp: 22 26  18   Height:   5\' 6"  (1.676 m)   Weight:   127.007 kg (280 lb)   SpO2: 99% 98%  100%     General:  Obese AAM, NAD  Eyes:  PERRL, anicteric, non-injected.  ENT:  Nares clear.  OP clear, non-erythematous without plaques or exudates.  MMM.  Neck:  Supple without TM or JVD.    Lymph:  No cervical, supraclavicular, or submandibular LAD.  Cardiovascular:  RRR except for frequent PVC, normal S1, S2, without m/r/g.  2+ pulses, warm extremities  Respiratory:  CTA bilaterally without increased WOB.  Abdomen:  NABS.  Soft, ND/NT.    Skin:  No rashes or focal lesions.  Musculoskeletal:  Normal bulk and tone.  Trace ankle LE edema.  Psychiatric:  A & O x 4.  Appropriate affect.  Neurologic:  CN 3-12 intact.  5/5 strength.  Sensation intact.  Labs on Admission:  Basic Metabolic Panel:  Recent Labs Lab 01/16/13 2245  NA 135  K 3.5  CL 100  CO2 26  GLUCOSE 124*  BUN 7  CREATININE 0.81  CALCIUM 8.8   Liver Function Tests:  Recent Labs Lab 01/16/13 2245  AST 16  ALT 19  ALKPHOS 91  BILITOT 0.2*  PROT 6.7  ALBUMIN 3.2*   No results found for this basename: LIPASE, AMYLASE,  in the last 168 hours No results found for this basename: AMMONIA,  in the last 168 hours CBC:  Recent Labs Lab 01/16/13 2245  WBC 8.1  HGB 12.5*  HCT 37.8*  MCV 82.5  PLT 261   Cardiac Enzymes: No results found for this basename: CKTOTAL, CKMB, CKMBINDEX, TROPONINI,  in the last 168 hours  BNP (last 3 results) No results found for this basename: PROBNP,  in the last 8760 hours CBG:  Recent Labs Lab 01/16/13 2155  GLUCAP 132*    Radiological Exams on Admission: Dg Chest 2 View  01/16/2013   *RADIOLOGY REPORT*  Clinical Data: Chest pain  CHEST - 2 VIEW  Comparison: 02/18/2011  Findings: The cardiac shadow is  stable.  The lungs are clear bilaterally.  No acute bony abnormality is seen.  IMPRESSION: No acute abnormality noted.   Original Report Authenticated By: Alcide Clever, M.D.    EKG: Independently reviewed. ECG with NSR, frequent PVC on telemetry, isolated T-wave inversion in lead 3, no ST-segment elevation or depression.    Assessment/Plan Principal Problem:   Angina at rest Active Problems:   DM   HYPERLIPIDEMIA-MIXED   Obesity, unspecified   OBSTRUCTIVE SLEEP APNEA   HYPERTENSION, ESSENTIAL NOS   CAD, NATIVE  VESSEL  Chest pain:  Differential includes ACS in this patient with risk factors of male with known nonobstructive CAD, hx of cigarette dependence, diabetes, obesity, hypertension, and hyperlipidemia.  Differential also includes GERD and MSK-related pain.  No evidence of PNA, PTX on CXR.  No tachycardia or SOB to suggest PE.  Frequent PVC may be sign of myocardial irritability. -  Telemetry -  Cycle troponins and ECG -  A1c -  Lipid panel -  Continue aspirin -  Start beta blocker -  Increase statin to high dose for now -  NTG prn chest pain -  Maalox prn -  Start protonix -  Please call cardiology in AM for possible stress vs. Cardiac catheterization -  Add ethanol level and UDS -  Did not heparinize, but will start if troponins start to rise  HTN/HLD:  Blood pressure was low post NTG, but now elevated -  Start carvedilol -  Statin as above  T2DM,  - hold oral medications -  A1c -  Start low dose SSI and titrate  OSA, noncompliant -  Counseled importance of compliance and ordered CPAP   Obesity, would likely help with chronic joint pains  -  Counseled weight loss  Chronic normocytic anemia -  Iron, B12, folate, TSH panel  Diet:  NPO pending probable procedure in AM Access:  piv IVF:  NS Proph:  lovenox   Code Status: full Family Communication: spoke with patient alone Disposition Plan: Admit to telemetry at [facility]   Time spent: 60 min Renae Fickle [facility] Pager [phone]  If 7PM-7AM, please contact night-coverage [email or web address] Password [email or web address] 01/17/2013, 1:10 AM

## 2013-01-17 NOTE — ED Notes (Signed)
MD at bedside. 

## 2013-01-17 NOTE — Consult Note (Signed)
CARDIOLOGY CONSULT NOTE  Patient ID: Alexander Mack MRN: [id number], DOB/AGE: [birth year]   Admit date: 01/16/2013 Date of Consult: 01/17/2013  Primary Physician: [name], MD Primary Cardiologist: Lovina Reach, MD   Pt. Profile  52 y/o male with h/o chest pain and nonobstructive CAD who was admitted overnight w/ recurrent chest pain and neg CE.  Problem List  Past Medical History  Diagnosis Date  . CAD (coronary artery disease)     a. 11.2007 Cath: LM 6mm, nl, LAD 30-29m, D2 30-40, LCX 20-30p, RCA nl, EF 50%;  b. 10.2012 Myoview: no isch/scar.  . Obese   . Dyslipidemia   . Diabetes mellitus   . History of arthroscopy   . 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 Plantar fascial fibromatosis   . Nonspecific abnormal results of liver function study   . Personal history of noncompliance with medical treatment, presenting hazards to health   . Unspecified essential hypertension   . Alcohol abuse   . Sexually transmitted disease   . Drug dependence   . Anxiety and depression   . Chronic pain     due to slipped disk, arthritis    Past Surgical History  Procedure Laterality Date  . Appendectomy    . Cardiac catheterization  2007     Allergies  Allergies  Allergen Reactions  . Shellfish Allergy Shortness Of Breath, Nausea Only and Palpitations    HPI   52 year old male with prior history of chest pain and nonobstructive coronary artery disease by cardiac catheterization in 2007. He had subsequent negative Myoview in October of 2012. Patient lives at home with his 61 year old daughter and is out on disability related to depression. He is sedentary and inactive. He was in his usual state of health until approximately noon yesterday, when he was lying down at home and had sudden onset of 7-8 of 10 dull pressure-like sensation in his left chest radiating down his left arm, associated with dyspnea, and diaphoresis. Symptoms persisted for  approximately 9 hours at home prior to seeking treatment. He presented to the ED at approximately 9 PM last night and was treated with aspirin and nitroglycerin without immediate relief. ECG was nonacute and initial troponin was normal. He was subsequently admitted by internal medicine and says that his chest pain finally subsided at approximately 3 AM, 15 hours after onset. Despite prolonged symptoms, followup troponin and ECG are normal. He is currently pain-free and and eager to go home.  Inpatient Medications  . aspirin  81 mg Oral Daily  . atorvastatin  80 mg Oral Daily  . carvedilol  6.25 mg Oral BID WC  . enoxaparin (LOVENOX) injection  40 mg Subcutaneous Q24H  . escitalopram  30 mg Oral Daily  . pantoprazole (PROTONIX) IV  40 mg Intravenous Q12H  . traZODone  100 mg Oral QHS   Family History Family History  Problem Relation Age of Onset  . Heart disease Mother 7  . Diabetes Mother   . Diabetes Brother     Social History History   Social History  . Marital Status: Single    Spouse Name: N/A    Number of Children: N/A  . Years of Education: N/A   Occupational History  . Not on file.   Social History Main Topics  . Smoking status: Former Smoker -- 1.00 packs/day for 11 years    Types: Cigarettes    Quit date: 05/24/1983  . Smokeless tobacco: Never Used  . Alcohol Use:  No     Comment: previously alcohol use, none since 2005  . Drug Use: No     Comment: previously used cocaine, none since 2005  . Sexual Activity: Not on file   Other Topics Concern  . Not on file   Social History Narrative   Single. Lives with child. Disability.      Review of Systems  General:  No chills, fever, night sweats or weight changes.  Cardiovascular:  +++ chest pain and dyspnea yesterday.  No edema, orthopnea, palpitations, paroxysmal nocturnal dyspnea. Dermatological: No rash, lesions/masses Respiratory: No cough, dyspnea Urologic: No hematuria, dysuria Abdominal:   No nausea,  vomiting, diarrhea, bright red blood per rectum, melena, or hematemesis Neurologic:  No visual changes, wkns, changes in mental status. All other systems reviewed and are otherwise negative except as noted above.  Physical Exam  Blood pressure 121/83, pulse 64, temperature 97.4 F (36.3 C), temperature source Oral, resp. rate 20, height 5\' 6"  (1.676 m), weight 290 lb 9.6 oz (131.815 kg), SpO2 100.00%.  General: Pleasant, NAD Psych: Normal affect. Neuro: Alert and oriented X 3. Moves all extremities spontaneously. HEENT: Normal  Neck: Supple without bruits.  Obese, difficult to assess jvp. Lungs:  Resp regular and unlabored, CTA. Heart: RRR no s3, s4, or murmurs. Abdomen: Obese, soft, non-tender, non-distended, BS + x 4.  Extremities: No clubbing, cyanosis or edema. DP/PT/Radials 2+ and equal bilaterally.  Labs   Recent Labs  01/17/13 0314  TROPONINI <0.30   Lab Results  Component Value Date   WBC 7.3 01/17/2013   HGB 12.3* 01/17/2013   HCT 37.2* 01/17/2013   MCV 82.3 01/17/2013   PLT 244 01/17/2013    Recent Labs Lab 01/16/13 2245 01/17/13 0400  NA 135 141  K 3.5 4.0  CL 100 106  CO2 26 27  BUN 7 7  CREATININE 0.81 0.83  CALCIUM 8.8 8.8  PROT 6.7  --   BILITOT 0.2*  --   ALKPHOS 91  --   ALT 19  --   AST 16  --   GLUCOSE 124* 79   Lab Results  Component Value Date   CHOL 133 01/17/2013   HDL 59 01/17/2013   LDLCALC 65 01/17/2013   TRIG 45 01/17/2013   Radiology/Studies  Dg Chest 2 View  01/16/2013   *RADIOLOGY REPORT*  Clinical Data: Chest pain  CHEST - 2 VIEW  Comparison: 02/18/2011  Findings: The cardiac shadow is stable.  The lungs are clear bilaterally.  No acute bony abnormality is seen.  IMPRESSION: No acute abnormality noted.   Original Report Authenticated By: Alcide Clever, M.D.   ECG  Rsr, 60, no acute st/t changes.  ASSESSMENT AND PLAN  1.  Left sided chest pain/nonobstructive CAD:  Pt presented with left sided chest pain associated with dyspnea  and left arm pain.  Ss lasted a total of ~ 15 hrs prior to resolving.  Despite prolonged Ss, he has no objective evidence of ischemia and he is currently pain free.  Recommend discharge home and we have arranged for cardiology f/u on 9/15 @ 12:10 PM.  We will give additional consideration to repeat outpatient stress testing at that time, if he has recurrent Ss.  Cont PPI.  2.  DM:  Fasting gluc 79 this AM.  On metformin @ home.  3.  HL: LDL 65 on statin Rx.  LFT's wnl.   4.  Morbid Obesity:  Would benefit from nutritional counseling.  Signed, Nicolasa Ducking, NP 01/17/2013, 10:32  AM Patient seen and examined and history reviewed. Agree with above findings and plan. Morbidly obese BM with history of DM and HL admitted for evaluation of prolonged chest pain. Pain constant for approximately 15 hours. Associated with nausea. Exam is unremarkable except for morbid obesity. No chest wall tenderness to palpation. Cardiac enzymes and Ecgs are normal. Prior cardiac evaluation with cath 2007 and myoview 10/12 were normal. I think his pain is likely noncardiac and he is at low risk. I would recommend DC today on PPI therapy. Can arrange follow up with Dr. Tenny Craw in our office.  Theron Arista [facility] 01/17/2013 11:08 AM

## 2013-01-17 NOTE — Progress Notes (Signed)
[facility] PROGRESS NOTE  CATO LIBURD ZOX:[id number] DOB: [birth year] DOA: 01/16/2013 PCP: [name], MD  Assessment/Plan: 52 y.o. year-old male with history of hypertension,T2DM, hyperlipidemia,obesity, OSA noncompliant with CPAP, previous tobacco abuse, and nonobstructive CAD via 2007 cath (LAD showed a 30-40% distal narrowing. Circumflex had a 20-30% proximal narrowing. D2 had a 30 to 40% narrowing) followed by Dr. Tenny Craw,, but has not seen her for over one year. The patient had acute onset of left-sided chest discomfort around noontime on 01/16/2013. This is associated with some shortness of breath, nausea, and diaphoresis. As a result, the patient went to the [facility] ED for further evaluation.in the ED, the patient's chest discomfort was relieved with SL NTG x 2.EKG showed nonspecific T-wave inversion in III. Chest x-ray was negative anti-stat troponin was negative.  1. Chest pain atypical but multiple risk factors with h/o non obstructive CAD;  -h/o CAD via 2007 cath (LAD showed a 30-40% distal narrowing. Circumflex had a 20-30% proximal narrowing. D2 had a 30 to 40% narrowing) followed by Dr. Tenny Craw -initial ECG, trop unremarkable; chest pain resolved; monitor serial trop, ECG, c/s cardiology for possible stress test; vs LHC; d/w [facility] cards  -keep NPO until evaluated, IVF maintenance  -cont ASA, BB, Statin, PPI for GERD  2. HTN/HLD: Blood pressure was low post NTG, but now elevated  - Start carvedilol, - Statin  3. T2DM,  - hold oral medications; check A1c, Start low dose SSI and titrate  4. OSA, noncompliant  - Counseled importance of compliance and ordered CPAP  5. Obesity, would likely help with chronic joint pains  - Counseled weight loss  6. Chronic normocytic anemia  - Iron, B12, folate, TSH panel; pend   Diet: NPO pending probable procedure in AM  Access: piv  IVF: NS  Proph: lovenox   Code Status: full  Family Communication: family not present at the bedside   (indicate person spoken with, relationship, and if by phone, the number) Disposition Plan: likely home    Consultants:  Cardiology   Procedures:  No/pend   Antibiotics:  None  (indicate start date, and stop date if known)  HPI/Subjective: Patient reports no chest pain, no SOB no nausea vomiting   Objective: Filed Vitals:   01/17/13 0306  BP: 121/83  Pulse: 64  Temp: 97.4 F (36.3 C)  Resp: 20    Intake/Output Summary (Last 24 hours) at 01/17/13 0919 Last data filed at 01/17/13 0800  Gross per 24 hour  Intake      0 ml  Output    400 ml  Net   -400 ml   Filed Weights   01/16/13 2234 01/17/13 0306  Weight: 127.007 kg (280 lb) 131.815 kg (290 lb 9.6 oz)    Exam:   General:  Alert, oriented, awake   Cardiovascular: S1, S2 RRR   Respiratory: CTA BL   Abdomen: soft, NT, ND +BS   Musculoskeletal: no LE edema    Data Reviewed: Basic Metabolic Panel:  Recent Labs Lab 01/16/13 2245 01/17/13 0400  NA 135 141  K 3.5 4.0  CL 100 106  CO2 26 27  GLUCOSE 124* 79  BUN 7 7  CREATININE 0.81 0.83  CALCIUM 8.8 8.8   Liver Function Tests:  Recent Labs Lab 01/16/13 2245  AST 16  ALT 19  ALKPHOS 91  BILITOT 0.2*  PROT 6.7  ALBUMIN 3.2*   No results found for this basename: LIPASE, AMYLASE,  in the last 168 hours No results found for  this basename: AMMONIA,  in the last 168 hours CBC:  Recent Labs Lab 01/16/13 2245 01/17/13 0400  WBC 8.1 7.3  HGB 12.5* 12.3*  HCT 37.8* 37.2*  MCV 82.5 82.3  PLT 261 244   Cardiac Enzymes:  Recent Labs Lab 01/17/13 0314  TROPONINI <0.30   BNP (last 3 results)  Recent Labs  01/17/13 0314  PROBNP 17.6   CBG:  Recent Labs Lab 01/16/13 2155 01/17/13 0746  GLUCAP 132* 66*    No results found for this or any previous visit (from the past 240 hour(s)).   Studies: Dg Chest 2 View  01/16/2013   *RADIOLOGY REPORT*  Clinical Data: Chest pain  CHEST - 2 VIEW  Comparison: 02/18/2011  Findings: The  cardiac shadow is stable.  The lungs are clear bilaterally.  No acute bony abnormality is seen.  IMPRESSION: No acute abnormality noted.   Original Report Authenticated By: Alcide Clever, M.D.    Scheduled Meds: . aspirin  81 mg Oral Daily  . atorvastatin  80 mg Oral Daily  . carvedilol  6.25 mg Oral BID WC  . enoxaparin (LOVENOX) injection  40 mg Subcutaneous Q24H  . escitalopram  30 mg Oral Daily  . pantoprazole (PROTONIX) IV  40 mg Intravenous Q12H  . traZODone  100 mg Oral QHS   Continuous Infusions: . sodium chloride 1,000 mL (01/17/13 0322)    Principal Problem:   Angina at rest Active Problems:   DM   HYPERLIPIDEMIA-MIXED   Obesity, unspecified   OBSTRUCTIVE SLEEP APNEA   HYPERTENSION, ESSENTIAL NOS   CAD, NATIVE VESSEL    Time spent: > 25 minutes     Esperanza Sheets  [facility] Pager [phone]. If 7PM-7AM, please contact night-coverage at [email or web address], password [email or web address] 01/17/2013, 9:19 AM  LOS: 1 day

## 2013-01-17 NOTE — Plan of Care (Signed)
Problem: Consults Goal: Tobacco Cessation referral if indicated Outcome: Not Applicable Date Met:  01/17/13 Pt quit smoking over 11 years ago.  Problem: Phase I Progression Outcomes Goal: Aspirin unless contraindicated Outcome: Completed/Met Date Met:  01/17/13 Pt given 324 mg of ASA in the ED at [facility]

## 2013-01-22 NOTE — ED Provider Notes (Addendum)
Shared service with midlevel provider. I have personally seen and examined the patient, providing direct face to face care, presenting with the chief complaint of chest pain, sounds typical. Physical exam findings include unremarkable, but she has cardiac risk factors, previous CATH (non obstructive) . Plan will be to admit for ACS workup. I have reviewed the nursing documentation on past medical history, family history, and social history.   Derwood Kaplan, MD 01/22/13 1537  Derwood Kaplan, MD 01/23/13 0981

## 2013-01-30 ENCOUNTER — Encounter: Payer: Self-pay | Admitting: *Deleted

## 2013-02-04 ENCOUNTER — Ambulatory Visit (INDEPENDENT_AMBULATORY_CARE_PROVIDER_SITE_OTHER): Payer: PRIVATE HEALTH INSURANCE | Admitting: Physician Assistant

## 2013-02-04 ENCOUNTER — Telehealth: Payer: Self-pay | Admitting: *Deleted

## 2013-02-04 ENCOUNTER — Encounter: Payer: Self-pay | Admitting: Physician Assistant

## 2013-02-04 VITALS — BP 142/98 | HR 78 | Ht 66.0 in | Wt 284.0 lb

## 2013-02-04 DIAGNOSIS — I4949 Other premature depolarization: Secondary | ICD-10-CM

## 2013-02-04 DIAGNOSIS — E785 Hyperlipidemia, unspecified: Secondary | ICD-10-CM

## 2013-02-04 DIAGNOSIS — R079 Chest pain, unspecified: Secondary | ICD-10-CM

## 2013-02-04 DIAGNOSIS — I2510 Atherosclerotic heart disease of native coronary artery without angina pectoris: Secondary | ICD-10-CM

## 2013-02-04 DIAGNOSIS — I493 Ventricular premature depolarization: Secondary | ICD-10-CM

## 2013-02-04 LAB — BASIC METABOLIC PANEL
Calcium: 9.5 mg/dL (ref 8.4–10.5)
Creatinine, Ser: 1 mg/dL (ref 0.4–1.5)
Sodium: 139 mEq/L (ref 135–145)

## 2013-02-04 NOTE — Patient Instructions (Signed)
Labs today:  BMET Schedule follow up with Dr. Dietrich Pates in 06/2013.  You will receive a card in the mail 2-3 mos before you need to schedule your appointment.

## 2013-02-04 NOTE — Progress Notes (Signed)
[address], [address] [place], [place]  [address] Phone: [phone] Fax:  [phone]  Date:  02/04/2013   ID:  Alexander Mack, DOB [birth year], MRN [id number]  PCP:  [name], MD  Cardiologist:  Dr. Dietrich Pates     History of Present Illness: Alexander Mack is a 52 y.o. male who returns for follow up after a recent admission to the hospital with chest pain. He has a history of nonobstructive CAD by cardiac catheterization in 2007 and negative Myoview in 2012, DM2, HL, sleep apnea, obesity.  He was admitted to the hospital last month after presenting with left chest pressure radiating into his left arm with associated dyspnea and diaphoresis that was fairly constant for about 9 hours before presentation. Cardiac markers and ECG remained normal. His pain subsided. Without objective evidence of ischemia, no further inpatient workup was recommended. Patient was placed on a PPI. He tells me that since starting this he has had no further chest symptoms.  The patient denies chest pain, shortness of breath, syncope, orthopnea, PND or significant pedal edema.   Labs (8/13):  K 4, creatinine 0.3, ALT 19, LDL 65, Hgb 12.3, TSH of 0.937  Wt Readings from Last 3 Encounters:  02/04/13 284 lb (128.822 kg)  01/17/13 290 lb 9.6 oz (131.815 kg)  09/18/12 293 lb 6.4 oz (133.085 kg)     Past Medical History  Diagnosis Date  . CAD (coronary artery disease)     a. 11.2007 Cath: LM 6mm, nl, LAD 30-86m, D2 30-40, LCX 20-30p, RCA nl, EF 50%;  b. 10.2012 Myoview: no isch/scar.  . Obese   . Dyslipidemia   . Diabetes mellitus   . History of arthroscopy   . 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 Plantar fascial fibromatosis   . Nonspecific abnormal results of liver function study   . Personal history of noncompliance with medical treatment, presenting hazards to health   . Unspecified essential hypertension   . Alcohol abuse   . Sexually transmitted disease    . Drug dependence   . Anxiety and depression   . Chronic pain     due to slipped disk, arthritis    Current Outpatient Prescriptions  Medication Sig Dispense Refill  . aspirin 81 MG tablet Take 81 mg by mouth daily.      Marland Kitchen atorvastatin (LIPITOR) 20 MG tablet Take 20 mg by mouth daily.        Marland Kitchen escitalopram (LEXAPRO) 10 MG tablet Take 30 mg by mouth daily.      Marland Kitchen glimepiride (AMARYL) 4 MG tablet Take 4 mg by mouth daily before breakfast.      . metFORMIN (GLUCOPHAGE) 1000 MG tablet Take 1,000 mg by mouth 2 (two) times daily with a meal.      . pantoprazole (PROTONIX) 40 MG tablet Take 1 tablet (40 mg total) by mouth daily.  30 tablet  0  . traZODone (DESYREL) 100 MG tablet Take 100 mg by mouth at bedtime.        Marland Kitchen VITAMIN E PO Take 1 capsule by mouth daily.       No current facility-administered medications for this visit.    Allergies:    Allergies  Allergen Reactions  . Shellfish Allergy Shortness Of Breath, Nausea Only and Palpitations    Social History:  The patient  reports that he quit smoking about 29 years ago. His smoking use included Cigarettes. He has a 11 pack-year smoking history.  He has never used smokeless tobacco. He reports that he does not drink alcohol or use illicit drugs.   ROS:  Please see the history of present illness.      All other systems reviewed and negative.   PHYSICAL EXAM: VS:  BP 142/98  Pulse 78  Ht 5\' 6"  (1.676 m)  Wt 284 lb (128.822 kg)  BMI 45.86 kg/m2 Well nourished, well developed, in no acute distress HEENT: normal Neck: no JVD Cardiac:  normal S1, S2; RRR; no murmur Lungs:  clear to auscultation bilaterally, no wheezing, rhonchi or rales Abd: soft, nontender, no hepatomegaly Ext: no edema Skin: warm and dry Neuro:  CNs 2-12 intact, no focal abnormalities noted  EKG:  NSR, HR 78, normal axis, no acute changes, PVCs     ASSESSMENT AND PLAN:  1. Chest Pain: Resolved. This was likely gastrointestinal symptoms. Continue PPI and  followup with PCP. No further cardiac workup warranted at this time. 2. CAD: No angina. Continue aspirin and statin. 3. Hyperlipidemia: Lipids controlled. Continue current therapy. 4. Elevated Blood Pressure: Blood pressures are usually optimal. Continue to monitor for now. 5. PVCs: Check a basic metabolic panel today. 6. Disposition: Followup with Dr. Tenny Craw in 06/2013.  Signed, Tereso Newcomer, PA-C  02/04/2013 1:23 PM

## 2013-02-04 NOTE — Telephone Encounter (Signed)
lmom normal labs .Marland KitchenMarland Kitchen

## 2013-02-26 ENCOUNTER — Emergency Department (HOSPITAL_COMMUNITY)
Admission: EM | Admit: 2013-02-26 | Discharge: 2013-02-26 | Disposition: A | Discharge status: Home / Self Care | Payer: PRIVATE HEALTH INSURANCE | Attending: Emergency Medicine | Admitting: Emergency Medicine

## 2013-02-26 ENCOUNTER — Encounter (HOSPITAL_COMMUNITY): Payer: Self-pay | Admitting: Emergency Medicine

## 2013-02-26 DIAGNOSIS — F3289 Other specified depressive episodes: Secondary | ICD-10-CM | POA: Insufficient documentation

## 2013-02-26 DIAGNOSIS — Z79899 Other long term (current) drug therapy: Secondary | ICD-10-CM | POA: Insufficient documentation

## 2013-02-26 DIAGNOSIS — Z87891 Personal history of nicotine dependence: Secondary | ICD-10-CM | POA: Insufficient documentation

## 2013-02-26 DIAGNOSIS — J3489 Other specified disorders of nose and nasal sinuses: Secondary | ICD-10-CM | POA: Insufficient documentation

## 2013-02-26 DIAGNOSIS — E669 Obesity, unspecified: Secondary | ICD-10-CM | POA: Insufficient documentation

## 2013-02-26 DIAGNOSIS — H9209 Otalgia, unspecified ear: Secondary | ICD-10-CM | POA: Insufficient documentation

## 2013-02-26 DIAGNOSIS — G8929 Other chronic pain: Secondary | ICD-10-CM | POA: Insufficient documentation

## 2013-02-26 DIAGNOSIS — E785 Hyperlipidemia, unspecified: Secondary | ICD-10-CM | POA: Insufficient documentation

## 2013-02-26 DIAGNOSIS — I10 Essential (primary) hypertension: Secondary | ICD-10-CM | POA: Insufficient documentation

## 2013-02-26 DIAGNOSIS — H7290 Unspecified perforation of tympanic membrane, unspecified ear: Secondary | ICD-10-CM | POA: Insufficient documentation

## 2013-02-26 DIAGNOSIS — F329 Major depressive disorder, single episode, unspecified: Secondary | ICD-10-CM | POA: Insufficient documentation

## 2013-02-26 DIAGNOSIS — E119 Type 2 diabetes mellitus without complications: Secondary | ICD-10-CM | POA: Insufficient documentation

## 2013-02-26 DIAGNOSIS — Z8739 Personal history of other diseases of the musculoskeletal system and connective tissue: Secondary | ICD-10-CM | POA: Insufficient documentation

## 2013-02-26 DIAGNOSIS — I2510 Atherosclerotic heart disease of native coronary artery without angina pectoris: Secondary | ICD-10-CM | POA: Insufficient documentation

## 2013-02-26 DIAGNOSIS — Z7982 Long term (current) use of aspirin: Secondary | ICD-10-CM | POA: Insufficient documentation

## 2013-02-26 DIAGNOSIS — H9201 Otalgia, right ear: Secondary | ICD-10-CM

## 2013-02-26 DIAGNOSIS — F411 Generalized anxiety disorder: Secondary | ICD-10-CM | POA: Insufficient documentation

## 2013-02-26 NOTE — ED Notes (Signed)
Pt reports "feeling something moving" in r/ear x 4 days. Denies pain

## 2013-02-26 NOTE — ED Provider Notes (Signed)
CSN: [id number]     Arrival date & time 02/26/13  1216 History  This chart was scribed for non-physician practitioner working with Shon Baton, MD by Ashley Jacobs, ED scribe. This patient was seen in room WTR6/WTR6 and the patient's care was started at 12:28 PM   First MD Initiated Contact with Patient 02/26/13 1223     Chief Complaint  Patient presents with  . Foreign Body in Ear    c/o "something moving" in r/ear x 4 days    Patient is a 52 y.o. male presenting with foreign body in ear. The history is provided by the patient and medical records. No language interpreter was used.  Foreign Body in Ear This is a new problem. The current episode started more than 2 days ago. The problem occurs constantly. The problem has not changed since onset.Pertinent negatives include no abdominal pain and no headaches. Nothing aggravates the symptoms. Nothing relieves the symptoms. He has tried nothing for the symptoms. The treatment provided no relief.  HPI   HPI Comments: Alexander Mack is a 52 y.o. male who presents to the Emergency Department complaining of a foreign body in his right ear with onset of four days ago. Pt reports that he feel the sensation of "something moving" in his right ear. He denies injecting any foreign objects in his ear canal or submerging his ears in water. He also denies the symptoms of cough, fever, allergies, or URI. Pt also denies tinnitus. No hearing change.   Nothing seems to relieve or worsen his symptoms.  Pt is a former smoker and reports quitting 05/24/83 and he denies alcohol use.  Past Medical History  Diagnosis Date  . CAD (coronary artery disease)     a. 11.2007 Cath: LM 6mm, nl, LAD 30-1m, D2 30-40, LCX 20-30p, RCA nl, EF 50%;  b. 10.2012 Myoview: no isch/scar.  . Obese   . Dyslipidemia   . Diabetes mellitus   . History of arthroscopy   . 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 Plantar fascial fibromatosis   .  Nonspecific abnormal results of liver function study   . Personal history of noncompliance with medical treatment, presenting hazards to health   . Unspecified essential hypertension   . Alcohol abuse   . Sexually transmitted disease   . Drug dependence   . Anxiety and depression   . Chronic pain     due to slipped disk, arthritis   Past Surgical History  Procedure Laterality Date  . Appendectomy    . Cardiac catheterization  2007   Family History  Problem Relation Age of Onset  . Heart disease Mother 92  . Diabetes Mother   . Diabetes Brother    History  Substance Use Topics  . Smoking status: Former Smoker -- 1.00 packs/day for 11 years    Types: Cigarettes    Quit date: 05/24/1983  . Smokeless tobacco: Never Used  . Alcohol Use: No     Comment: previously alcohol use, none since 2005    Review of Systems  Constitutional: Negative for fever, chills and fatigue.  HENT: Positive for rhinorrhea. Negative for hearing loss, congestion, sore throat, neck stiffness, sinus pressure and ear discharge.        Foreign body in right ear  Eyes: Negative for redness.  Respiratory: Negative for cough and wheezing.   Gastrointestinal: Negative for nausea, vomiting, abdominal pain and diarrhea.  Genitourinary: Negative for dysuria.  Musculoskeletal: Negative for myalgias.  Skin:  Negative for rash.  Neurological: Negative for headaches.  Hematological: Negative for adenopathy.    Allergies  Shellfish allergy  Home Medications   Current Outpatient Rx  Name  Route  Sig  Dispense  Refill  . aspirin 81 MG tablet   Oral   Take 81 mg by mouth daily.         Marland Kitchen atorvastatin (LIPITOR) 20 MG tablet   Oral   Take 20 mg by mouth daily.           Marland Kitchen escitalopram (LEXAPRO) 10 MG tablet   Oral   Take 30 mg by mouth daily.         Marland Kitchen glimepiride (AMARYL) 4 MG tablet   Oral   Take 4 mg by mouth daily before breakfast.         . metFORMIN (GLUCOPHAGE) 1000 MG tablet   Oral    Take 1,000 mg by mouth 2 (two) times daily with a meal.         . pantoprazole (PROTONIX) 40 MG tablet   Oral   Take 1 tablet (40 mg total) by mouth daily.   30 tablet   0   . traZODone (DESYREL) 100 MG tablet   Oral   Take 100 mg by mouth at bedtime.           Marland Kitchen VITAMIN E PO   Oral   Take 1 capsule by mouth daily.          Pulse 87  Resp 18  Wt 280 lb (127.007 kg)  BMI 45.21 kg/m2  SpO2 98% Physical Exam  Nursing note and vitals reviewed. Constitutional: He appears well-developed and well-nourished.  HENT:  Head: Normocephalic and atraumatic.  Right Ear: Hearing, tympanic membrane, external ear and ear canal normal. No drainage, swelling or tenderness. No foreign bodies. Tympanic membrane is not perforated, not erythematous and not bulging.  Left Ear: Hearing, external ear and ear canal normal. No foreign bodies. Tympanic membrane is perforated (chronic).  Nose: Nose normal. No mucosal edema or rhinorrhea.  Mouth/Throat: Uvula is midline, oropharynx is clear and moist and mucous membranes are normal. Mucous membranes are not dry. No trismus in the jaw. No edematous. No oropharyngeal exudate, posterior oropharyngeal edema, posterior oropharyngeal erythema or tonsillar abscesses.  Eyes: Conjunctivae are normal. Right eye exhibits no discharge. Left eye exhibits no discharge.  Neck: Normal range of motion. Neck supple.  Cardiovascular: Normal rate, regular rhythm and normal heart sounds.   Pulmonary/Chest: Effort normal and breath sounds normal. No respiratory distress. He has no wheezes. He has no rales.  Abdominal: Soft. There is no tenderness.  Neurological: He is alert.  Skin: Skin is warm and dry.  Psychiatric: He has a normal mood and affect.    ED Course  Procedures (including critical care time) DIAGNOSTIC STUDIES: Oxygen Saturation is 98% on room air, normal by my interpretation.    COORDINATION OF CARE: 12:33 PM Discussed course of care with pt . Pt  understands and agrees.   Labs Review Labs Reviewed - No data to display Imaging Review No results found.  Vital signs reviewed and are as follows: Filed Vitals:   02/26/13 1228  BP: 123/76  Pulse:   Resp:    Ear irrigated by nurse with return of wax. Patient reports improvement in symptoms. Recheck, canal clear, no foreign bodies, tympanic membrane appears normal.  Counseled patient on peroxide plus water to soften wax.  Patient urged to return with worsening symptoms or other concerns.  Patient verbalized understanding and agrees with plan.    MDM   1. Ear pain, right    Patient with foreign body sensation in right ear. No obvious foreign body seen the patient does have significant amount of cerumen. This was cleared with irrigation. No foreign body in recheck. No change in hearing. Patient feels better. Will discharge with PCP followup as needed.    Renne Crigler, PA-C 02/26/13 1252

## 2013-02-26 NOTE — ED Provider Notes (Signed)
Medical screening examination/treatment/procedure(s) were performed by non-physician practitioner and as supervising physician I was immediately available for consultation/collaboration.  Shon Baton, MD 02/26/13 1754

## 2013-06-17 ENCOUNTER — Other Ambulatory Visit: Payer: Self-pay | Admitting: Podiatry

## 2013-09-18 ENCOUNTER — Encounter: Payer: Self-pay | Admitting: Pulmonary Disease

## 2013-09-18 ENCOUNTER — Ambulatory Visit (INDEPENDENT_AMBULATORY_CARE_PROVIDER_SITE_OTHER): Payer: Medicare Other | Admitting: Pulmonary Disease

## 2013-09-18 VITALS — BP 146/96 | HR 74 | Temp 98.2°F | Ht 66.0 in | Wt 301.4 lb

## 2013-09-18 DIAGNOSIS — G4733 Obstructive sleep apnea (adult) (pediatric): Secondary | ICD-10-CM

## 2013-09-18 NOTE — Assessment & Plan Note (Signed)
The patient continues to wear CPAP off and on, and feels that he sleeps much better with the device with increased daytime alertness. The nights he does not wear her is because he falls asleep on the sofa watching television. I have asked him to do this in the bedroom with the CPAP on, and can put the television on a timer.  The other option is to move his CPAP machine into the family room and to wear while he is watching TV late at night. I've also encouraged him to work aggressively on weight loss, and to keep up with his mask changes and supplies.

## 2013-09-18 NOTE — Patient Instructions (Signed)
Stay on cpap, and keep up with mask changes and supplies. Try and wear mask everynight. Work on weight loss followup with me again in one year.

## 2013-09-18 NOTE — Progress Notes (Signed)
   Subjective:    Patient ID: Alexander Mack, male    DOB: 08/01/1960, 53 y.o.   MRN: [id number]  HPI The patient comes in today for followup of his obstructive sleep apnea. He is wearing CPAP about 50% of the nights, and the nights he does not wear his because he falls asleep on the sofa watching television. I have discussed with him various ways to improve on this.  He is having no issues with his mask fit or pressure, and feels the machine helps his sleep and daytime alertness.   Review of Systems  Constitutional: Negative for fever and unexpected weight change.  HENT: Negative for congestion, dental problem, ear pain, nosebleeds, postnasal drip, rhinorrhea, sinus pressure, sneezing, sore throat and trouble swallowing.   Eyes: Negative for redness and itching.  Respiratory: Negative for cough, chest tightness, shortness of breath and wheezing.   Cardiovascular: Negative for palpitations and leg swelling.  Gastrointestinal: Negative for nausea and vomiting.  Genitourinary: Negative for dysuria.  Musculoskeletal: Negative for joint swelling.  Skin: Negative for rash.  Neurological: Negative for headaches.  Hematological: Does not bruise/bleed easily.  Psychiatric/Behavioral: Negative for dysphoric mood. The patient is not nervous/anxious.        Objective:   Physical Exam Obese male in no acute distress Nose without purulence or discharge noted No skin breakdown or pressure necrosis from the CPAP mask Neck without lymphadenopathy or thyromegaly Lower extremities with mild edema, no cyanosis Alert and oriented, moves all 4 extremities       Assessment & Plan:

## 2013-09-26 ENCOUNTER — Ambulatory Visit (INDEPENDENT_AMBULATORY_CARE_PROVIDER_SITE_OTHER): Payer: Medicare Other | Admitting: Internal Medicine

## 2013-09-26 ENCOUNTER — Encounter: Payer: Self-pay | Admitting: Internal Medicine

## 2013-09-26 VITALS — BP 128/90 | HR 88 | Ht 66.0 in | Wt 296.0 lb

## 2013-09-26 DIAGNOSIS — E785 Hyperlipidemia, unspecified: Secondary | ICD-10-CM

## 2013-09-26 DIAGNOSIS — I10 Essential (primary) hypertension: Secondary | ICD-10-CM

## 2013-09-26 DIAGNOSIS — I2510 Atherosclerotic heart disease of native coronary artery without angina pectoris: Secondary | ICD-10-CM

## 2013-09-26 MED ORDER — TRIAMTERENE-HCTZ 37.5-25 MG PO TABS: 0.5000 | ORAL_TABLET | Freq: Every day | ORAL | Status: DC

## 2013-09-26 NOTE — Progress Notes (Signed)
HPI Alexander Mack is a 53 y.o. male who returns for follow up after a recent admission to the hospital with chest pain. He has a history of nonobstructive CAD by cardiac catheterization in 2007 and negative Myoview in 2012, DM2, HL, sleep apnea, obesity. He was admitted to the hospital last month after presenting with left chest pressure radiating into his left arm with associated dyspnea and diaphoresis that was fairly constant for about 9 hours before presentation. Cardiac markers and ECG remained normal. His pain subsided. Without objective evidence of ischemia, no further inpatient workup was recommended. Patient was placed on a PPI.  He was seen by Kathleen Argue after    Allergies  Allergen Reactions  . Shellfish Allergy Shortness Of Breath, Nausea Only and Palpitations    Current Outpatient Prescriptions  Medication Sig Dispense Refill  . atorvastatin (LIPITOR) 20 MG tablet Take 20 mg by mouth daily.        Marland Kitchen buPROPion (WELLBUTRIN XL) 150 MG 24 hr tablet Take 150 mg by mouth daily.       Marland Kitchen escitalopram (LEXAPRO) 10 MG tablet Take 30 mg by mouth daily.      Marland Kitchen GINSENG PO Take 1 capsule by mouth daily.      Marland Kitchen glimepiride (AMARYL) 4 MG tablet Take 4 mg by mouth at bedtime.       . hydrOXYzine (ATARAX/VISTARIL) 25 MG tablet Take 50 mg by mouth at bedtime.       Marland Kitchen latanoprost (XALATAN) 0.005 % ophthalmic solution Place 1 drop into both eyes at bedtime.       . metFORMIN (GLUCOPHAGE) 1000 MG tablet Take 1,000 mg by mouth 2 (two) times daily with a meal.      . Multiple Vitamin (MULTIVITAMIN WITH MINERALS) TABS tablet Take 1 tablet by mouth daily.      . Vitamin D, Ergocalciferol, (DRISDOL) 50000 UNITS CAPS capsule Take 50,000 Units by mouth every Monday.      Marland Kitchen VITAMIN E PO Take 1 capsule by mouth daily.       No current facility-administered medications for this visit.    Past Medical History  Diagnosis Date  . CAD (coronary artery disease)     a. 11.2007 Cath: LM 70mm, nl, LAD 30-28m,  D2 30-40, LCX 20-30p, RCA nl, EF 50%;  b. 10.2012 Myoview: no isch/scar.  . Obese   . Dyslipidemia   . Diabetes mellitus   . History of arthroscopy   . 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 Plantar fascial fibromatosis   . Nonspecific abnormal results of liver function study   . Personal history of noncompliance with medical treatment, presenting hazards to health   . Unspecified essential hypertension   . Alcohol abuse   . Sexually transmitted disease   . Drug dependence   . Anxiety and depression   . Chronic pain     due to slipped disk, arthritis    Past Surgical History  Procedure Laterality Date  . Appendectomy    . Cardiac catheterization  2007    Family History  Problem Relation Age of Onset  . Heart disease Mother 46  . Diabetes Mother   . Diabetes Brother     History   Social History  . Marital Status: Single    Spouse Name: N/A    Number of Children: N/A  . Years of Education: N/A   Occupational History  . Not on file.   Social History Main Topics  . Smoking  status: Former Smoker -- 1.00 packs/day for 11 years    Types: Cigarettes    Quit date: 05/24/1983  . Smokeless tobacco: Never Used  . Alcohol Use: No     Comment: previously alcohol use, none since 2005  . Drug Use: No     Comment: previously used cocaine, none since 2005  . Sexual Activity: Not on file   Other Topics Concern  . Not on file   Social History Narrative   Single. Lives with child. Disability.      Review of Systems:  All systems reviewed.  They are negative to the above problem except as previously stated.  Vital Signs: BP 128/90  Pulse 88  Ht 5\' 6"  (1.676 m)  Wt 296 lb (134.265 kg)  BMI 47.80 kg/m2 On my check 132/92 Physical Exam Patient is a morbidly obese 53 yo in NAD HEENT:  Normocephalic, atraumatic. EOMI, PERRLA.  Neck: JVP is normal.  No bruits.  Lungs: clear to auscultation. No rales no wheezes.  Heart: Regular rate and rhythm.  Normal S1, S2. No S3.   No significant murmurs. PMI not displaced.  Abdomen:  Obese nontender. Normal bowel sounds.    Extremities:   Good distal pulses throughout. Triv lower extremity edema.  Musculoskeletal :moving all extremities.  Neuro:   alert and oriented x3.  CN II-XII grossly intact.   Assessment and Plan:  1.  HTN Diastolic is a little elevated  On review of recent clinic visits it has been in 90s on several Will rx with 1/2 Maxzide.  F/U in 6 wks  2.  CAD  Mild at caht  Denies CP    3.  GERD  Has PPI to take as needed  Denies symtpoms  4.  HL  Excellent LDL control  5. Sleep apnea  Encouraged him to continue CPAP  6  Morbid obesity  Encouraged wt loss  Diet and activity

## 2013-09-26 NOTE — Patient Instructions (Addendum)
Your physician has recommended you make the following change in your medication:  BEGIN TAKING MAXIDE 37.5/25---TAKE 1/2 (HALF) TABLET ONCE A DAY.  Your physician recommends that you schedule a follow-up appointment in: [place].

## 2013-11-01 ENCOUNTER — Ambulatory Visit (INDEPENDENT_AMBULATORY_CARE_PROVIDER_SITE_OTHER): Payer: Medicare Other | Admitting: Internal Medicine

## 2013-11-01 DIAGNOSIS — I10 Essential (primary) hypertension: Secondary | ICD-10-CM

## 2013-11-01 DIAGNOSIS — E119 Type 2 diabetes mellitus without complications: Secondary | ICD-10-CM

## 2013-11-01 DIAGNOSIS — I2510 Atherosclerotic heart disease of native coronary artery without angina pectoris: Secondary | ICD-10-CM

## 2013-11-01 DIAGNOSIS — E785 Hyperlipidemia, unspecified: Secondary | ICD-10-CM

## 2013-11-01 NOTE — Progress Notes (Signed)
HPI Alexander Mack is a 53 y.o. male who returns for follow up after a recent admission to the hospital with chest pain. He has a history of nonobstructive CAD by cardiac catheterization in 2007 and negative Myoview in 2012, DM2, HL, sleep apnea, obesity. He was admitted to the hospital last month after presenting with left chest pressure radiating into his left arm with associated dyspnea and diaphoresis that was fairly constant for about 9 hours before presentation. Cardiac markers and ECG remained normal. His pain subsided. Without objective evidence of ischemia, no further inpatient workup was recommended. Patient was placed on a PPI.  I saw the patient a few wks ago.    Allergies  Allergen Reactions  . Shellfish Allergy Shortness Of Breath, Nausea Only and Palpitations    Current Outpatient Prescriptions  Medication Sig Dispense Refill  . atorvastatin (LIPITOR) 20 MG tablet Take 20 mg by mouth daily.        Marland Kitchen buPROPion (WELLBUTRIN XL) 150 MG 24 hr tablet Take 150 mg by mouth daily.       Marland Kitchen escitalopram (LEXAPRO) 10 MG tablet Take 30 mg by mouth daily.      Marland Kitchen GINSENG PO Take 1 capsule by mouth daily.      Marland Kitchen glimepiride (AMARYL) 4 MG tablet Take 4 mg by mouth at bedtime.       . hydrOXYzine (ATARAX/VISTARIL) 25 MG tablet Take 50 mg by mouth at bedtime.       Marland Kitchen latanoprost (XALATAN) 0.005 % ophthalmic solution Place 1 drop into both eyes at bedtime.       . metFORMIN (GLUCOPHAGE) 1000 MG tablet Take 1,000 mg by mouth 2 (two) times daily with a meal.      . Multiple Vitamin (MULTIVITAMIN WITH MINERALS) TABS tablet Take 1 tablet by mouth daily.      Marland Kitchen triamterene-hydrochlorothiazide (MAXZIDE-25) 37.5-25 MG per tablet Take 0.5 tablets by mouth daily.  90 tablet  1  . Vitamin D, Ergocalciferol, (DRISDOL) 50000 UNITS CAPS capsule Take 50,000 Units by mouth every Monday.      Marland Kitchen VITAMIN E PO Take 1 capsule by mouth daily.       No current facility-administered medications for this visit.     Past Medical History  Diagnosis Date  . CAD (coronary artery disease)     a. 11.2007 Cath: LM 1mm, nl, LAD 30-24m, D2 30-40, LCX 20-30p, RCA nl, EF 50%;  b. 10.2012 Myoview: no isch/scar.  . Obese   . Dyslipidemia   . Diabetes mellitus   . History of arthroscopy   . 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 Plantar fascial fibromatosis   . Nonspecific abnormal results of liver function study   . Personal history of noncompliance with medical treatment, presenting hazards to health   . Unspecified essential hypertension   . Alcohol abuse   . Sexually transmitted disease   . Drug dependence   . Anxiety and depression   . Chronic pain     due to slipped disk, arthritis    Past Surgical History  Procedure Laterality Date  . Appendectomy    . Cardiac catheterization  2007    Family History  Problem Relation Age of Onset  . Heart disease Mother 26  . Diabetes Mother   . Diabetes Brother     History   Social History  . Marital Status: Single    Spouse Name: N/A    Number of Children: N/A  . Years of  Education: N/A   Occupational History  . Not on file.   Social History Main Topics  . Smoking status: Former Smoker -- 1.00 packs/day for 11 years    Types: Cigarettes    Quit date: 05/24/1983  . Smokeless tobacco: Never Used  . Alcohol Use: No     Comment: previously alcohol use, none since 2005  . Drug Use: No     Comment: previously used cocaine, none since 2005  . Sexual Activity: Not on file   Other Topics Concern  . Not on file   Social History Narrative   Single. Lives with child. Disability.      Review of Systems:  All systems reviewed.  They are negative to the above problem except as previously stated.  Vital Signs: There were no vitals taken for this visit. On my check 132/92 Physical Exam Patient is a morbidly obese 53 yo in NAD HEENT:  Normocephalic, atraumatic. EOMI, PERRLA.  Neck: JVP is normal.  No bruits.   Lungs: clear to auscultation. No rales no wheezes.  Heart: Regular rate and rhythm. Normal S1, S2. No S3.   No significant murmurs. PMI not displaced.  Abdomen:  Obese nontender. Normal bowel sounds.    Extremities:   Good distal pulses throughout. Triv lower extremity edema.  Musculoskeletal :moving all extremities.  Neuro:   alert and oriented x3.  CN II-XII grossly intact.   Assessment and Plan:  1.  HTN Diastolic is a little elevated  On review of recent clinic visits it has been in 90s on several Will rx with 1/2 Maxzide.  F/U in 6 wks  2.  CAD  Mild at caht  Denies CP    3.  GERD  Has PPI to take as needed  Denies symtpoms  4.  HL  Excellent LDL control  5. Sleep apnea  Encouraged him to continue CPAP  6  Morbid obesity  Encouraged wt loss  Diet and activity

## 2013-11-13 ENCOUNTER — Encounter: Payer: Self-pay | Admitting: Internal Medicine

## 2014-08-04 ENCOUNTER — Emergency Department (HOSPITAL_COMMUNITY)
Admission: EM | Admit: 2014-08-04 | Discharge: 2014-08-04 | Disposition: A | Discharge status: Home / Self Care | Payer: PPO | Attending: Emergency Medicine | Admitting: Emergency Medicine

## 2014-08-04 ENCOUNTER — Encounter (HOSPITAL_COMMUNITY): Payer: Self-pay | Admitting: Emergency Medicine

## 2014-08-04 ENCOUNTER — Emergency Department (HOSPITAL_COMMUNITY): Payer: PPO

## 2014-08-04 DIAGNOSIS — Z8619 Personal history of other infectious and parasitic diseases: Secondary | ICD-10-CM | POA: Diagnosis not present

## 2014-08-04 DIAGNOSIS — Z9119 Patient's noncompliance with other medical treatment and regimen: Secondary | ICD-10-CM | POA: Insufficient documentation

## 2014-08-04 DIAGNOSIS — I10 Essential (primary) hypertension: Secondary | ICD-10-CM | POA: Diagnosis not present

## 2014-08-04 DIAGNOSIS — I2510 Atherosclerotic heart disease of native coronary artery without angina pectoris: Secondary | ICD-10-CM | POA: Diagnosis not present

## 2014-08-04 DIAGNOSIS — G8929 Other chronic pain: Secondary | ICD-10-CM | POA: Diagnosis not present

## 2014-08-04 DIAGNOSIS — Y939 Activity, unspecified: Secondary | ICD-10-CM | POA: Diagnosis not present

## 2014-08-04 DIAGNOSIS — Y999 Unspecified external cause status: Secondary | ICD-10-CM | POA: Insufficient documentation

## 2014-08-04 DIAGNOSIS — F419 Anxiety disorder, unspecified: Secondary | ICD-10-CM | POA: Diagnosis not present

## 2014-08-04 DIAGNOSIS — H9203 Otalgia, bilateral: Secondary | ICD-10-CM | POA: Insufficient documentation

## 2014-08-04 DIAGNOSIS — Z79899 Other long term (current) drug therapy: Secondary | ICD-10-CM | POA: Insufficient documentation

## 2014-08-04 DIAGNOSIS — J069 Acute upper respiratory infection, unspecified: Secondary | ICD-10-CM

## 2014-08-04 DIAGNOSIS — W57XXXA Bitten or stung by nonvenomous insect and other nonvenomous arthropods, initial encounter: Secondary | ICD-10-CM | POA: Insufficient documentation

## 2014-08-04 DIAGNOSIS — E669 Obesity, unspecified: Secondary | ICD-10-CM | POA: Diagnosis not present

## 2014-08-04 DIAGNOSIS — Y929 Unspecified place or not applicable: Secondary | ICD-10-CM | POA: Diagnosis not present

## 2014-08-04 DIAGNOSIS — E119 Type 2 diabetes mellitus without complications: Secondary | ICD-10-CM | POA: Diagnosis not present

## 2014-08-04 DIAGNOSIS — Z9889 Other specified postprocedural states: Secondary | ICD-10-CM | POA: Diagnosis not present

## 2014-08-04 DIAGNOSIS — Z87891 Personal history of nicotine dependence: Secondary | ICD-10-CM | POA: Insufficient documentation

## 2014-08-04 DIAGNOSIS — S40862A Insect bite (nonvenomous) of left upper arm, initial encounter: Secondary | ICD-10-CM | POA: Diagnosis not present

## 2014-08-04 DIAGNOSIS — E785 Hyperlipidemia, unspecified: Secondary | ICD-10-CM | POA: Diagnosis not present

## 2014-08-04 DIAGNOSIS — Z8739 Personal history of other diseases of the musculoskeletal system and connective tissue: Secondary | ICD-10-CM | POA: Diagnosis not present

## 2014-08-04 DIAGNOSIS — R05 Cough: Secondary | ICD-10-CM | POA: Diagnosis present

## 2014-08-04 DIAGNOSIS — F329 Major depressive disorder, single episode, unspecified: Secondary | ICD-10-CM | POA: Insufficient documentation

## 2014-08-04 HISTORY — DX: Other specified postprocedural states: Z98.890

## 2014-08-04 NOTE — Discharge Instructions (Signed)
Upper Respiratory Infection, Adult An upper respiratory infection (URI) is also sometimes known as the common cold. The upper respiratory tract includes the nose, sinuses, throat, trachea, and bronchi. Bronchi are the airways leading to the lungs. Most people improve within 1 week, but symptoms can last up to 2 weeks. A residual cough may last even longer.  CAUSES Many different viruses can infect the tissues lining the upper respiratory tract. The tissues become irritated and inflamed and often become very moist. Mucus production is also common. A cold is contagious. You can easily spread the virus to others by oral contact. This includes kissing, sharing a glass, coughing, or sneezing. Touching your mouth or nose and then touching a surface, which is then touched by another person, can also spread the virus. SYMPTOMS  Symptoms typically develop 1 to 3 days after you come in contact with a cold virus. Symptoms vary from person to person. They may include:  Runny nose.  Sneezing.  Nasal congestion.  Sinus irritation.  Sore throat.  Loss of voice (laryngitis).  Cough.  Fatigue.  Muscle aches.  Loss of appetite.  Headache.  Low-grade fever. DIAGNOSIS  You might diagnose your own cold based on familiar symptoms, since most people get a cold 2 to 3 times a year. Your caregiver can confirm this based on your exam. Most importantly, your caregiver can check that your symptoms are not due to another disease such as strep throat, sinusitis, pneumonia, asthma, or epiglottitis. Blood tests, throat tests, and X-rays are not necessary to diagnose a common cold, but they may sometimes be helpful in excluding other more serious diseases. Your caregiver will decide if any further tests are required. RISKS AND COMPLICATIONS  You may be at risk for a more severe case of the common cold if you smoke cigarettes, have chronic heart disease (such as heart failure) or lung disease (such as asthma), or if  you have a weakened immune system. The very young and very old are also at risk for more serious infections. Bacterial sinusitis, middle ear infections, and bacterial pneumonia can complicate the common cold. The common cold can worsen asthma and chronic obstructive pulmonary disease (COPD). Sometimes, these complications can require emergency medical care and may be life-threatening. PREVENTION  The best way to protect against getting a cold is to practice good hygiene. Avoid oral or hand contact with people with cold symptoms. Wash your hands often if contact occurs. There is no clear evidence that vitamin C, vitamin E, echinacea, or exercise reduces the chance of developing a cold. However, it is always recommended to get plenty of rest and practice good nutrition. TREATMENT  Treatment is directed at relieving symptoms. There is no cure. Antibiotics are not effective, because the infection is caused by a virus, not by bacteria. Treatment may include:  Increased fluid intake. Sports drinks offer valuable electrolytes, sugars, and fluids.  Breathing heated mist or steam (vaporizer or shower).  Eating chicken soup or other clear broths, and maintaining good nutrition.  Getting plenty of rest.  Using gargles or lozenges for comfort.  Controlling fevers with ibuprofen or acetaminophen as directed by your caregiver.  Increasing usage of your inhaler if you have asthma. Zinc gel and zinc lozenges, taken in the first 24 hours of the common cold, can shorten the duration and lessen the severity of symptoms. Pain medicines may help with fever, muscle aches, and throat pain. A variety of non-prescription medicines are available to treat congestion and runny nose. Your caregiver   can make recommendations and may suggest nasal or lung inhalers for other symptoms.  HOME CARE INSTRUCTIONS   Only take over-the-counter or prescription medicines for pain, discomfort, or fever as directed by your  caregiver.  Use a warm mist humidifier or inhale steam from a shower to increase air moisture. This may keep secretions moist and make it easier to breathe.  Drink enough water and fluids to keep your urine clear or pale yellow.  Rest as needed.  Return to work when your temperature has returned to normal or as your caregiver advises. You may need to stay home longer to avoid infecting others. You can also use a face mask and careful hand washing to prevent spread of the virus. SEEK MEDICAL CARE IF:   After the first few days, you feel you are getting worse rather than better.  You need your caregiver's advice about medicines to control symptoms.  You develop chills, worsening shortness of breath, or brown or red sputum. These may be signs of pneumonia.  You develop yellow or brown nasal discharge or pain in the face, especially when you bend forward. These may be signs of sinusitis.  You develop a fever, swollen neck glands, pain with swallowing, or white areas in the back of your throat. These may be signs of strep throat. SEEK IMMEDIATE MEDICAL CARE IF:   You have a fever.  You develop severe or persistent headache, ear pain, sinus pain, or chest pain.  You develop wheezing, a prolonged cough, cough up blood, or have a change in your usual mucus (if you have chronic lung disease).  You develop sore muscles or a stiff neck. Document Released: 11/02/2000 Document Revised: 08/01/2011 Document Reviewed: 08/14/2013 [employer] Patient Information 2015 [employer]. This information is not intended to replace advice given to you by your health care provider. Make sure you discuss any questions you have with your health care provider.  

## 2014-08-04 NOTE — ED Provider Notes (Signed)
CSN: [id number]     Arrival date & time 08/04/14  0139 History  This chart was scribed for Lacretia Leigh, MD by Eustaquio Maize, ED Scribe. This patient was seen in room D35C/D35C and the patient's care was started at 2:34 AM.    Chief Complaint  Patient presents with  . Nasal Congestion  . Cough    nonproductive  . Back Pain  . Insect Bite    Right arm    The history is provided by the patient. No language interpreter was used.     HPI Comments: Alexander Mack is a 54 y.o. male who presents to the Emergency Department complaining of nonproductive cough and nasal congestion that began approximately 2 days ago. Pt also complains of an insect bite to the left arm. He states that he took a medication similar to Nyquil with no relief. Pt also complains of bilateral ear pain. Denies fever, nausea, vomiting, chest pain, shortness of breath, or any other symptoms.    Past Medical History  Diagnosis Date  . CAD (coronary artery disease)     a. 11.2007 Cath: LM 58mm, nl, LAD 30-7m, D2 30-40, LCX 20-30p, RCA nl, EF 50%;  b. 10.2012 Myoview: no isch/scar.  . Obese   . Dyslipidemia   . Diabetes mellitus   . History of arthroscopy   . 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 Plantar fascial fibromatosis   . Nonspecific abnormal results of liver function study   . Personal history of noncompliance with medical treatment, presenting hazards to health   . Unspecified essential hypertension   . Alcohol abuse   . Sexually transmitted disease   . Drug dependence   . Anxiety and depression   . Chronic pain     due to slipped disk, arthritis  . History of cardiac cath    Past Surgical History  Procedure Laterality Date  . Appendectomy    . Cardiac catheterization  2007   Family History  Problem Relation Age of Onset  . Heart disease Mother 50  . Diabetes Mother   . Diabetes Brother    History  Substance Use Topics  . Smoking status: Former Smoker -- 1.00  packs/day for 11 years    Types: Cigarettes    Quit date: 05/24/1983  . Smokeless tobacco: Never Used  . Alcohol Use: No     Comment: previously alcohol use, none since 2005    Review of Systems  Constitutional: Negative for fever.  HENT: Positive for congestion and ear pain.   Respiratory: Positive for cough. Negative for shortness of breath.   Cardiovascular: Negative for chest pain.  Skin:       Insect bite to left arm.   All other systems reviewed and are negative.     Allergies  Shellfish allergy  Home Medications   Prior to Admission medications   Medication Sig Start Date End Date Taking? Authorizing Provider  atorvastatin (LIPITOR) 20 MG tablet Take 20 mg by mouth daily.      Historical Provider, MD  buPROPion (WELLBUTRIN XL) 150 MG 24 hr tablet Take 150 mg by mouth daily.  02/19/13   Historical Provider, MD  escitalopram (LEXAPRO) 10 MG tablet Take 30 mg by mouth daily.    Historical Provider, MD  GINSENG PO Take 1 capsule by mouth daily.    Historical Provider, MD  glimepiride (AMARYL) 4 MG tablet Take 4 mg by mouth at bedtime.     Historical Provider, MD  hydrOXYzine (  ATARAX/VISTARIL) 25 MG tablet Take 50 mg by mouth at bedtime.  02/14/13   Historical Provider, MD  latanoprost (XALATAN) 0.005 % ophthalmic solution Place 1 drop into both eyes at bedtime.  02/06/13   Historical Provider, MD  metFORMIN (GLUCOPHAGE) 1000 MG tablet Take 1,000 mg by mouth 2 (two) times daily with a meal.    Historical Provider, MD  Multiple Vitamin (MULTIVITAMIN WITH MINERALS) TABS tablet Take 1 tablet by mouth daily.    Historical Provider, MD  triamterene-hydrochlorothiazide (MAXZIDE-25) 37.5-25 MG per tablet Take 0.5 tablets by mouth daily. 09/26/13   Fay Records, MD  Vitamin D, Ergocalciferol, (DRISDOL) 50000 UNITS CAPS capsule Take 50,000 Units by mouth every Monday.    Historical Provider, MD  VITAMIN E PO Take 1 capsule by mouth daily.    Historical Provider, MD   Triage Vitals: BP  127/81 mmHg  Pulse 99  Temp(Src) 98.8 F (37.1 C) (Oral)  Resp 22  SpO2 99%   Physical Exam  Constitutional: He is oriented to person, place, and time. He appears well-developed and well-nourished.  Non-toxic appearance. No distress.  HENT:  Head: Normocephalic and atraumatic.  Right Ear: Tympanic membrane normal.  Left Ear: Tympanic membrane normal.  Mouth/Throat: Oropharynx is clear and moist.  No lymphadenopathy.   Eyes: Conjunctivae, EOM and lids are normal. Pupils are equal, round, and reactive to light.  Neck: Normal range of motion. Neck supple. No tracheal deviation present. No thyroid mass present.  Cardiovascular: Normal rate, regular rhythm and normal heart sounds.  Exam reveals no gallop.   No murmur heard. Pulmonary/Chest: Effort normal and breath sounds normal. No stridor. No respiratory distress. He has no decreased breath sounds. He has no wheezes. He has no rhonchi. He has no rales.  Abdominal: Soft. Normal appearance and bowel sounds are normal. He exhibits no distension. There is no tenderness. There is no rebound and no CVA tenderness.  Musculoskeletal: Normal range of motion. He exhibits no edema or tenderness.  Neurological: He is alert and oriented to person, place, and time. He has normal strength. No cranial nerve deficit or sensory deficit. GCS eye subscore is 4. GCS verbal subscore is 5. GCS motor subscore is 6.  Skin: Skin is warm and dry. No abrasion and no rash noted.  Psychiatric: He has a normal mood and affect. His speech is normal and behavior is normal.  Nursing note and vitals reviewed.   ED Course  Procedures (including critical care time)  DIAGNOSTIC STUDIES: Oxygen Saturation is 95% on RA, normal by my interpretation.    COORDINATION OF CARE: 2:38 AM-Discussed treatment plan which includes CXR Labs Review Labs Reviewed - No data to display  Imaging Review Dg Chest 2 View  08/04/2014   CLINICAL DATA:  Cough congestion and upper back pain  for 3 days  EXAM: CHEST  2 VIEW  COMPARISON:  01/16/2013  FINDINGS: The heart size and mediastinal contours are within normal limits. Both lungs are clear. The visualized skeletal structures are unremarkable.  IMPRESSION: No active cardiopulmonary disease.   Electronically Signed   By: Andreas Newport M.D.   On: 08/04/2014 02:30     EKG Interpretation   Date/Time:  Monday August 04 2014 01:45:01 EDT Ventricular Rate:  92 PR Interval:  176 QRS Duration: 90 QT Interval:  362 QTC Calculation: 447 R Axis:   59 Text Interpretation:  Normal sinus rhythm Normal ECG Confirmed by Nailah Luepke   MD, Asma Boldon ([id number]) on 08/04/2014 2:26:06 AM  MDM   Final diagnoses:  None    I personally performed the services described in this documentation, which was scribed in my presence. The recorded information has been reviewed and is accurate.  Pt with likely uri, stable for d/c     Lacretia Leigh, MD 08/04/14 [phone]

## 2014-08-04 NOTE — ED Notes (Signed)
Patient presents with a nonproductive cough, nasal congestion, back pain, right side arm insect bite.

## 2014-08-04 NOTE — ED Notes (Signed)
Pt placed in a gown and hooked up to the monitor with the 5 lead, BP cuff and pulse ox 

## 2014-09-19 ENCOUNTER — Encounter: Payer: Self-pay | Admitting: Pulmonary Disease

## 2014-09-19 ENCOUNTER — Ambulatory Visit (INDEPENDENT_AMBULATORY_CARE_PROVIDER_SITE_OTHER): Payer: PPO | Admitting: Pulmonary Disease

## 2014-09-19 ENCOUNTER — Encounter (INDEPENDENT_AMBULATORY_CARE_PROVIDER_SITE_OTHER): Payer: Self-pay

## 2014-09-19 VITALS — BP 122/64 | HR 88 | Temp 97.6°F | Ht 66.0 in | Wt 286.2 lb

## 2014-09-19 DIAGNOSIS — G4733 Obstructive sleep apnea (adult) (pediatric): Secondary | ICD-10-CM | POA: Diagnosis not present

## 2014-09-19 NOTE — Assessment & Plan Note (Signed)
The patient continues to do well with his C Pap device, and is having no issues with his mask fit or pressure. He is having a problem with water accumulating in his tube, and I have asked him to turn down the heat on his humidifier. He has lost 15 pounds since the last visit, and I have commended him on this. He needs to keep working on this going forward.

## 2014-09-19 NOTE — Patient Instructions (Signed)
Continue on cpap, and keep up with mask cushion changes and supplies. Keep working on weight loss.  You are doing well.  You received your machine in 2010.  Will probably need to wait one more year to get replaced.  Turn the heat DOWN on your heater in order to get moisture out of tubing.  If you are not sure how to do this, please contact your home care company followup again in one year.

## 2014-09-19 NOTE — Progress Notes (Signed)
   Subjective:    Patient ID: Alexander Mack, male    DOB: 1960/12/28, 54 y.o.   MRN: [id number]  HPI The patient comes in today for follow-up of his obstructive sleep apnea. He is wearing C Pap compliantly, and is having no issues with his mask fit or pressure. He is having some issues with water accumulating in his C Pap tubing, but has not made adjustments to his heated humidification. He feels that he sleeps well with the device, and is satisfied with his daytime alertness. He has lost 15 pounds since last visit.   Review of Systems  Constitutional: Negative for fever and unexpected weight change.  HENT: Negative for congestion, dental problem, ear pain, nosebleeds, postnasal drip, rhinorrhea, sinus pressure, sneezing, sore throat and trouble swallowing.   Eyes: Negative for redness and itching.  Respiratory: Negative for cough, chest tightness, shortness of breath and wheezing.   Cardiovascular: Negative for palpitations and leg swelling.  Gastrointestinal: Negative for nausea and vomiting.  Genitourinary: Negative for dysuria.  Musculoskeletal: Negative for joint swelling.  Skin: Negative for rash.  Neurological: Negative for headaches.  Hematological: Does not bruise/bleed easily.  Psychiatric/Behavioral: Negative for dysphoric mood. The patient is not nervous/anxious.        Objective:   Physical Exam Obese male in no acute distress Nose without purulence or discharge noted Neck without lymphadenopathy or thyromegaly No skin breakdown or pressure necrosis from the C Pap mask Lower extremities with mild edema, no cyanosis Alert and oriented, moves all 4 extremities.       Assessment & Plan:

## 2014-10-09 ENCOUNTER — Emergency Department (HOSPITAL_COMMUNITY)
Admission: EM | Admit: 2014-10-09 | Discharge: 2014-10-09 | Disposition: A | Discharge status: Home / Self Care | Payer: PPO | Attending: Emergency Medicine | Admitting: Emergency Medicine

## 2014-10-09 ENCOUNTER — Encounter (HOSPITAL_COMMUNITY): Payer: Self-pay | Admitting: Physical Medicine and Rehabilitation

## 2014-10-09 DIAGNOSIS — Z87891 Personal history of nicotine dependence: Secondary | ICD-10-CM | POA: Insufficient documentation

## 2014-10-09 DIAGNOSIS — I2510 Atherosclerotic heart disease of native coronary artery without angina pectoris: Secondary | ICD-10-CM | POA: Diagnosis not present

## 2014-10-09 DIAGNOSIS — F418 Other specified anxiety disorders: Secondary | ICD-10-CM | POA: Insufficient documentation

## 2014-10-09 DIAGNOSIS — E785 Hyperlipidemia, unspecified: Secondary | ICD-10-CM | POA: Insufficient documentation

## 2014-10-09 DIAGNOSIS — Z7982 Long term (current) use of aspirin: Secondary | ICD-10-CM | POA: Insufficient documentation

## 2014-10-09 DIAGNOSIS — Z8739 Personal history of other diseases of the musculoskeletal system and connective tissue: Secondary | ICD-10-CM | POA: Diagnosis not present

## 2014-10-09 DIAGNOSIS — G44209 Tension-type headache, unspecified, not intractable: Secondary | ICD-10-CM | POA: Diagnosis not present

## 2014-10-09 DIAGNOSIS — I10 Essential (primary) hypertension: Secondary | ICD-10-CM | POA: Insufficient documentation

## 2014-10-09 DIAGNOSIS — E669 Obesity, unspecified: Secondary | ICD-10-CM | POA: Insufficient documentation

## 2014-10-09 DIAGNOSIS — E119 Type 2 diabetes mellitus without complications: Secondary | ICD-10-CM | POA: Insufficient documentation

## 2014-10-09 DIAGNOSIS — Z9889 Other specified postprocedural states: Secondary | ICD-10-CM | POA: Insufficient documentation

## 2014-10-09 DIAGNOSIS — Z79899 Other long term (current) drug therapy: Secondary | ICD-10-CM | POA: Insufficient documentation

## 2014-10-09 DIAGNOSIS — G8929 Other chronic pain: Secondary | ICD-10-CM | POA: Insufficient documentation

## 2014-10-09 DIAGNOSIS — R51 Headache: Secondary | ICD-10-CM | POA: Diagnosis present

## 2014-10-09 DIAGNOSIS — Z91040 Latex allergy status: Secondary | ICD-10-CM | POA: Insufficient documentation

## 2014-10-09 DIAGNOSIS — Z8619 Personal history of other infectious and parasitic diseases: Secondary | ICD-10-CM | POA: Insufficient documentation

## 2014-10-09 LAB — I-STAT CHEM 8, ED
BUN: 8 mg/dL (ref 6–20)
CALCIUM ION: 1.2 mmol/L (ref 1.12–1.23)
CHLORIDE: 101 mmol/L (ref 101–111)
Creatinine, Ser: 0.9 mg/dL (ref 0.61–1.24)
GLUCOSE: 103 mg/dL — AB (ref 65–99)
HCT: 43 % (ref 39.0–52.0)
Hemoglobin: 14.6 g/dL (ref 13.0–17.0)
POTASSIUM: 4.1 mmol/L (ref 3.5–5.1)
Sodium: 140 mmol/L (ref 135–145)
TCO2: 23 mmol/L (ref 0–100)

## 2014-10-09 MED ORDER — KETOROLAC TROMETHAMINE 30 MG/ML IJ SOLN
30.0000 mg | Freq: Once | INTRAMUSCULAR | Status: DC
Start: 2014-10-09 — End: 2014-10-09
  Filled 2014-10-09: qty 1

## 2014-10-09 MED ORDER — PROCHLORPERAZINE EDISYLATE 5 MG/ML IJ SOLN
10.0000 mg | Freq: Once | INTRAMUSCULAR | Status: AC
  Administered 2014-10-09: 10 mg via INTRAVENOUS
  Filled 2014-10-09: qty 2

## 2014-10-09 MED ORDER — DIPHENHYDRAMINE HCL 50 MG/ML IJ SOLN
25.0000 mg | Freq: Once | INTRAMUSCULAR | Status: AC
  Administered 2014-10-09: 25 mg via INTRAVENOUS
  Filled 2014-10-09: qty 1

## 2014-10-09 MED ORDER — SODIUM CHLORIDE 0.9 % IV BOLUS (SEPSIS)
1000.0000 mL | Freq: Once | INTRAVENOUS | Status: AC
  Administered 2014-10-09: 1000 mL via INTRAVENOUS

## 2014-10-09 MED ORDER — METHOCARBAMOL 500 MG PO TABS: 1000.0000 mg | ORAL_TABLET | Freq: Four times a day (QID) | ORAL | Status: DC | PRN

## 2014-10-09 MED ORDER — METHOCARBAMOL 1000 MG/10ML IJ SOLN: 1000.0000 mg | Freq: Once | INTRAMUSCULAR | Status: DC

## 2014-10-09 MED ORDER — METHOCARBAMOL 1000 MG/10ML IJ SOLN
1000.0000 mg | Freq: Once | INTRAVENOUS | Status: AC
  Administered 2014-10-09: 1000 mg via INTRAVENOUS
  Filled 2014-10-09: qty 10

## 2014-10-09 NOTE — ED Notes (Signed)
CBG 103 in triage

## 2014-10-09 NOTE — ED Notes (Signed)
Pt presents to department for evaluation of headache and body aches. Onset Wednesday. Pt is alert and oriented x4 upon arrival to ED. NAD

## 2014-10-09 NOTE — ED Provider Notes (Signed)
CSN: [id number]     Arrival date & time 10/09/14  1041 History   First MD Initiated Contact with Patient 10/09/14 1139     Chief Complaint  Patient presents with  . Headache     (Consider location/radiation/quality/duration/timing/severity/associated sxs/prior Treatment) HPI   Alexander Mack is a 54 y.o. male complaining of bandlike pressure-like headache onset yesterday rated 7 out of 10 at worse, no exacerbating or alleviating symptoms associated with mild diffuse myalgia, dry cough, dry mouth. No pain medication taken prior to arrival. Pt denies fever, rash, confusion, cervicalgia, LOC/syncope, change in vision, N/V, numbness, weakness, dysarthria, ataxia, thunderclap onset, exacerbation with exertion or valsalva, exacerbation in morning, CP, SOB, abdominal pain, rhinorrhea, nasal congestion, otalgia. Patient reports that his daughter is sick with a viral illness. She is also in the ED for evaluation.   Past Medical History  Diagnosis Date  . CAD (coronary artery disease)     a. 11.2007 Cath: LM 26mm, nl, LAD 30-33m, D2 30-40, LCX 20-30p, RCA nl, EF 50%;  b. 10.2012 Myoview: no isch/scar.  . Obese   . Dyslipidemia   . Diabetes mellitus   . History of arthroscopy   . 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 Plantar fascial fibromatosis   . Nonspecific abnormal results of liver function study   . Personal history of noncompliance with medical treatment, presenting hazards to health   . Unspecified essential hypertension   . Alcohol abuse   . Sexually transmitted disease   . Drug dependence   . Anxiety and depression   . Chronic pain     due to slipped disk, arthritis  . History of cardiac cath    Past Surgical History  Procedure Laterality Date  . Appendectomy    . Cardiac catheterization  2007   Family History  Problem Relation Age of Onset  . Heart disease Mother 65  . Diabetes Mother   . Diabetes Brother    History  Substance Use Topics   . Smoking status: Former Smoker -- 1.00 packs/day for 11 years    Types: Cigarettes    Quit date: 05/24/1983  . Smokeless tobacco: Never Used  . Alcohol Use: No     Comment: previously alcohol use, none since 2005    Review of Systems  10 systems reviewed and found to be negative, except as noted in the HPI.   Allergies  Shellfish allergy and Latex  Home Medications   Prior to Admission medications   Medication Sig Start Date End Date Taking? Authorizing Provider  aspirin 81 MG tablet Take 81 mg by mouth daily.   Yes Historical Provider, MD  atorvastatin (LIPITOR) 20 MG tablet Take 20 mg by mouth daily.     Yes Historical Provider, MD  buPROPion (WELLBUTRIN XL) 300 MG 24 hr tablet Take 300 mg by mouth daily. 08/25/14  Yes Historical Provider, MD  escitalopram (LEXAPRO) 10 MG tablet Take 30 mg by mouth daily.   Yes Historical Provider, MD  latanoprost (XALATAN) 0.005 % ophthalmic solution Place 1 drop into both eyes at bedtime.  02/06/13  Yes Historical Provider, MD  metFORMIN (GLUCOPHAGE) 1000 MG tablet Take 1,000 mg by mouth 2 (two) times daily with a meal.   Yes Historical Provider, MD  Multiple Vitamin (MULTIVITAMIN WITH MINERALS) TABS tablet Take 1 tablet by mouth daily.   Yes Historical Provider, MD  oxybutynin (DITROPAN) 5 MG tablet Take 5 mg by mouth 2 (two) times daily. 08/04/14  Yes Historical Provider,  MD  VITAMIN E PO Take 1 capsule by mouth daily.   Yes Historical Provider, MD  glimepiride (AMARYL) 4 MG tablet Take 4 mg by mouth at bedtime.     Historical Provider, MD  methocarbamol (ROBAXIN) 500 MG tablet Take 2 tablets (1,000 mg total) by mouth 4 (four) times daily as needed (Pain). 10/09/14   Logyn Dedominicis, PA-C  triamterene-hydrochlorothiazide (MAXZIDE-25) 37.5-25 MG per tablet Take 0.5 tablets by mouth daily. Patient not taking: Reported on 10/09/2014 09/26/13   Fay Records, MD   BP 119/60 mmHg  Pulse 86  Temp(Src) 98 F (36.7 C) (Oral)  Resp 16  SpO2  98% Physical Exam  Constitutional: He is oriented to person, place, and time. He appears well-developed and well-nourished.  HENT:  Head: Normocephalic and atraumatic.  Mouth/Throat: Oropharynx is clear and moist.  Eyes: Conjunctivae and EOM are normal. Pupils are equal, round, and reactive to light.  Neck: Normal range of motion. Neck supple.  FROM to C-spine. Pt can touch chin to chest without discomfort. No TTP of midline cervical spine.   Cardiovascular: Normal rate, regular rhythm and intact distal pulses.   Pulmonary/Chest: Effort normal and breath sounds normal. No respiratory distress. He has no wheezes. He has no rales. He exhibits no tenderness.  Abdominal: Soft. Bowel sounds are normal. There is no tenderness.  Musculoskeletal: Normal range of motion. He exhibits no edema or tenderness.  Neurological: He is alert and oriented to person, place, and time. No cranial nerve deficit.  II-Visual fields grossly intact. III/IV/VI-Extraocular movements intact.  Pupils reactive bilaterally. V/VII-Smile symmetric, equal eyebrow raise,  facial sensation intact VIII- Hearing grossly intact IX/X-Normal gag XI-bilateral shoulder shrug XII-midline tongue extension Motor: 5/5 bilaterally with normal tone and bulk Cerebellar: Normal finger-to-nose  and normal heel-to-shin test.   Romberg negative Ambulates with a coordinated gait   Nursing note and vitals reviewed.   ED Course  Procedures (including critical care time) Labs Review Labs Reviewed  I-STAT CHEM 8, ED - Abnormal; Notable for the following:    Glucose, Bld 103 (*)    All other components within normal limits  CBG MONITORING, ED    Imaging Review No results found.   EKG Interpretation None      MDM   Final diagnoses:  Tension headache   Filed Vitals:   10/09/14 1200 10/09/14 1230 10/09/14 1315 10/09/14 1430  BP: 115/67 117/63 103/82 119/60  Pulse: 88 85 93 86  Temp:      TempSrc:      Resp:    16  SpO2:  95% 95% 95% 98%    Medications  sodium chloride 0.9 % bolus 1,000 mL (0 mLs Intravenous Stopped 10/09/14 1435)  prochlorperazine (COMPAZINE) injection 10 mg (10 mg Intravenous Given 10/09/14 1308)  diphenhydrAMINE (BENADRYL) injection 25 mg (25 mg Intravenous Given 10/09/14 1308)  methocarbamol (ROBAXIN) 1,000 mg in dextrose 5 % 50 mL IVPB (0 mg Intravenous Stopped 10/09/14 1345)    Alexander Mack is a pleasant 54 y.o. male presenting with HA. Presentation is like pts typical HA and non concerning for [facility], ICH, Meningitis, or temporal arteritis. Pt is afebrile with no focal neuro deficits, nuchal rigidity, or change in vision. Pt is to follow up with PCP to discuss prophylactic medication. Pt verbalizes understanding and is agreeable with plan to dc.   Evaluation does not show pathology that would require ongoing emergent intervention or inpatient treatment. Pt is hemodynamically stable and mentating appropriately. Discussed findings and plan with patient/guardian,  who agrees with care plan. All questions answered. Return precautions discussed and outpatient follow up given.   Discharge Medication List as of 10/09/2014  2:18 PM    START taking these medications   Details  methocarbamol (ROBAXIN) 500 MG tablet Take 2 tablets (1,000 mg total) by mouth 4 (four) times daily as needed (Pain)., Starting 10/09/2014, Until Discontinued, Print           Monico Blitz, PA-C 10/09/14 [place], MD 10/10/14 [phone]

## 2014-10-09 NOTE — Discharge Instructions (Signed)
For pain control you may take up to 800mg  of Motrin (also known as ibuprofen). That is usually 4 over the counter pills,  3 times a day. Take with food to minimize stomach irritation   You can also take  tylenol (acetaminophen) 975mg  (this is 3 over the counter pills) four times a day. Do not drink alcohol or combine with other medications that have acetaminophen as an ingredient (Read the labels!).    For breakthrough pain you may take Robaxin. Do not drink alcohol, drive or operate heavy machinery when taking Robaxin.  Please follow with your primary care doctor in the next 2 days for a check-up. They must obtain records for further management.   Do not hesitate to return to the Emergency Department for any new, worsening or concerning symptoms.    Tension Headache A tension headache is a feeling of pain, pressure, or aching often felt over the front and sides of the head. The pain can be dull or can feel tight (constricting). It is the most common type of headache. Tension headaches are not normally associated with nausea or vomiting and do not get worse with physical activity. Tension headaches can last 30 minutes to several days.  CAUSES  The exact cause is not known, but it may be caused by chemicals and hormones in the brain that lead to pain. Tension headaches often begin after stress, anxiety, or depression. Other triggers may include:  Alcohol.  Caffeine (too much or withdrawal).  Respiratory infections (colds, flu, sinus infections).  Dental problems or teeth clenching.  Fatigue.  Holding your head and neck in one position too long while using a computer. SYMPTOMS   Pressure around the head.   Dull, aching head pain.   Pain felt over the front and sides of the head.   Tenderness in the muscles of the head, neck, and shoulders. DIAGNOSIS  A tension headache is often diagnosed based on:   Symptoms.   Physical examination.   A CT scan or MRI of your head. These  tests may be ordered if symptoms are severe or unusual. TREATMENT  Medicines may be given to help relieve symptoms.  HOME CARE INSTRUCTIONS   Only take over-the-counter or prescription medicines for pain or discomfort as directed by your caregiver.   Lie down in a dark, quiet room when you have a headache.   Keep a journal to find out what may be triggering your headaches. For example, write down:  What you eat and drink.  How much sleep you get.  Any change to your diet or medicines.  Try massage or other relaxation techniques.   Ice packs or heat applied to the head and neck can be used. Use these 3 to 4 times per day for 15 to 20 minutes each time, or as needed.   Limit stress.   Sit up straight, and do not tense your muscles.   Quit smoking if you smoke.  Limit alcohol use.  Decrease the amount of caffeine you drink, or stop drinking caffeine.  Eat and exercise regularly.  Get 7 to 9 hours of sleep, or as recommended by your caregiver.  Avoid excessive use of pain medicine as recurrent headaches can occur.  SEEK MEDICAL CARE IF:   You have problems with the medicines you were prescribed.  Your medicines do not work.  You have a change from the usual headache.  You have nausea or vomiting. SEEK IMMEDIATE MEDICAL CARE IF:   Your headache becomes severe.  You have a fever.  You have a stiff neck.  You have loss of vision.  You have muscular weakness or loss of muscle control.  You lose your balance or have trouble walking.  You feel faint or pass out.  You have severe symptoms that are different from your first symptoms. MAKE SURE YOU:   Understand these instructions.  Will watch your condition.  Will get help right away if you are not doing well or get worse. Document Released: 05/09/2005 Document Revised: 08/01/2011 Document Reviewed: 04/29/2011 [facility] Patient Information 2015 [place], [place]. This information is not intended to  replace advice given to you by your health care provider. Make sure you discuss any questions you have with your health care provider.

## 2014-10-11 LAB — CBG MONITORING, ED: GLUCOSE-CAPILLARY: 103 mg/dL — AB (ref 65–99)

## 2015-01-26 ENCOUNTER — Encounter (HOSPITAL_COMMUNITY): Payer: Self-pay | Admitting: Emergency Medicine

## 2015-01-26 ENCOUNTER — Emergency Department (HOSPITAL_COMMUNITY)
Admission: EM | Admit: 2015-01-26 | Discharge: 2015-01-26 | Disposition: A | Discharge status: Home / Self Care | Payer: PPO | Attending: Emergency Medicine | Admitting: Emergency Medicine

## 2015-01-26 ENCOUNTER — Emergency Department (HOSPITAL_COMMUNITY): Payer: PPO

## 2015-01-26 DIAGNOSIS — R079 Chest pain, unspecified: Secondary | ICD-10-CM | POA: Diagnosis not present

## 2015-01-26 DIAGNOSIS — Z8739 Personal history of other diseases of the musculoskeletal system and connective tissue: Secondary | ICD-10-CM | POA: Diagnosis not present

## 2015-01-26 DIAGNOSIS — I10 Essential (primary) hypertension: Secondary | ICD-10-CM | POA: Insufficient documentation

## 2015-01-26 DIAGNOSIS — F329 Major depressive disorder, single episode, unspecified: Secondary | ICD-10-CM | POA: Diagnosis not present

## 2015-01-26 DIAGNOSIS — Z7982 Long term (current) use of aspirin: Secondary | ICD-10-CM | POA: Insufficient documentation

## 2015-01-26 DIAGNOSIS — E785 Hyperlipidemia, unspecified: Secondary | ICD-10-CM | POA: Diagnosis not present

## 2015-01-26 DIAGNOSIS — Z79899 Other long term (current) drug therapy: Secondary | ICD-10-CM | POA: Insufficient documentation

## 2015-01-26 DIAGNOSIS — F419 Anxiety disorder, unspecified: Secondary | ICD-10-CM | POA: Diagnosis not present

## 2015-01-26 DIAGNOSIS — Z91040 Latex allergy status: Secondary | ICD-10-CM | POA: Diagnosis not present

## 2015-01-26 DIAGNOSIS — Z8619 Personal history of other infectious and parasitic diseases: Secondary | ICD-10-CM | POA: Insufficient documentation

## 2015-01-26 DIAGNOSIS — I2510 Atherosclerotic heart disease of native coronary artery without angina pectoris: Secondary | ICD-10-CM | POA: Insufficient documentation

## 2015-01-26 DIAGNOSIS — R0602 Shortness of breath: Secondary | ICD-10-CM | POA: Insufficient documentation

## 2015-01-26 DIAGNOSIS — E119 Type 2 diabetes mellitus without complications: Secondary | ICD-10-CM | POA: Diagnosis not present

## 2015-01-26 DIAGNOSIS — J029 Acute pharyngitis, unspecified: Secondary | ICD-10-CM | POA: Diagnosis not present

## 2015-01-26 DIAGNOSIS — Z87891 Personal history of nicotine dependence: Secondary | ICD-10-CM | POA: Insufficient documentation

## 2015-01-26 DIAGNOSIS — G8929 Other chronic pain: Secondary | ICD-10-CM | POA: Diagnosis not present

## 2015-01-26 DIAGNOSIS — E669 Obesity, unspecified: Secondary | ICD-10-CM | POA: Insufficient documentation

## 2015-01-26 LAB — CBC
HCT: 40 % (ref 39.0–52.0)
Hemoglobin: 13.1 g/dL (ref 13.0–17.0)
MCH: 26.9 pg (ref 26.0–34.0)
MCHC: 32.8 g/dL (ref 30.0–36.0)
MCV: 82.1 fL (ref 78.0–100.0)
Platelets: 252 K/uL (ref 150–400)
RBC: 4.87 MIL/uL (ref 4.22–5.81)
RDW: 14.4 % (ref 11.5–15.5)
WBC: 4.5 K/uL (ref 4.0–10.5)

## 2015-01-26 LAB — BASIC METABOLIC PANEL WITH GFR
Anion gap: 5 (ref 5–15)
BUN: 13 mg/dL (ref 6–20)
CO2: 27 mmol/L (ref 22–32)
Calcium: 8.9 mg/dL (ref 8.9–10.3)
Chloride: 105 mmol/L (ref 101–111)
Creatinine, Ser: 1 mg/dL (ref 0.61–1.24)
GFR calc Af Amer: >60 mL/min
GFR calc non Af Amer: >60 mL/min
Glucose, Bld: 140 mg/dL — ABNORMAL HIGH (ref 65–99)
Potassium: 3.9 mmol/L (ref 3.5–5.1)
Sodium: 137 mmol/L (ref 135–145)

## 2015-01-26 LAB — I-STAT TROPONIN, ED: Troponin i, poc: 0.01 ng/mL (ref 0.00–0.08)

## 2015-01-26 LAB — CBG MONITORING, ED: Glucose-Capillary: 124 mg/dL — ABNORMAL HIGH (ref 65–99)

## 2015-01-26 NOTE — ED Notes (Addendum)
Pt states yesterday his throat began feeling dry and congested, with some SOB and chest pain to left chest. States the SOB has been getting worse since yesterday, with intermittent left chest pain. Complaining of feeling increasingly sweaty/dizzy. Denies back pain, weakness, nausea, vomiting. Unable to visualize back of throat, pt states he does not feel like it's swelling. Vitals stable, pt doesn't appear to be in any obvious distress. Pt stated no hx of heart/lung problems, lung sounds clear bilaterally

## 2015-01-26 NOTE — ED Provider Notes (Signed)
CSN: [id number]     Arrival date & time 01/26/15  1136 History   None    Chief Complaint  Patient presents with  . Shortness of Breath  . Chest Pain  . Sore Throat     (Consider location/radiation/quality/duration/timing/severity/associated sxs/prior Treatment) HPI Comments: Patient states that for the past 2 days with exertion he's had a stabbing sensation in his left chest that resolved spontaneously with rest.  This happened while he was mowing the lawn.  He rested pain resolved when he went back to mow the lawn.  He again had pain.  Has not had pain through the night and today again with exertion he's had this stabbing pain.  He does have a history of COPD he wears a C Pap at night.  He's had no recent fevers, cough, URI symptoms, trauma to his chest.  His symptoms are not associated with nausea or diaphoresis or increased shortness of breath during the day. Patient does have a history of hyperlipidemia, diabetes  Patient is a 54 y.o. male presenting with shortness of breath, chest pain, and pharyngitis. The history is provided by the patient.  Shortness of Breath Severity:  Mild Onset quality:  Gradual Duration:  2 days Timing:  Intermittent Progression:  Unchanged Chronicity:  New Context: activity   Relieved by:  Rest Worsened by:  Activity and exertion Associated symptoms: chest pain   Associated symptoms: no abdominal pain, no cough, no diaphoresis, no fever, no hemoptysis, no sore throat, no syncope and no vomiting   Chest Pain Pain location:  L chest Pain quality: stabbing   Pain radiates to:  Does not radiate Pain radiates to the back: no   Pain severity:  Mild Onset quality:  Sudden Duration:  2 days Timing:  Intermittent Progression:  Unchanged Chronicity:  New Context comment:  Mowing the lawn Relieved by:  Rest Worsened by:  Exertion Associated symptoms: shortness of breath   Associated symptoms: no abdominal pain, no altered mental status, no anxiety, no  back pain, no cough, no diaphoresis, no fever, no nausea, no numbness, no palpitations, no syncope, not vomiting and no weakness   Shortness of breath:    Severity:  Mild   Onset quality:  Sudden   Duration:  2 days   Timing:  Intermittent   Progression:  Resolved Sore Throat Associated symptoms include chest pain. Pertinent negatives include no abdominal pain, coughing, diaphoresis, fever, nausea, numbness, sore throat, vomiting or weakness.    Past Medical History  Diagnosis Date  . CAD (coronary artery disease)     a. 11.2007 Cath: LM 39mm, nl, LAD 30-33m, D2 30-40, LCX 20-30p, RCA nl, EF 50%;  b. 10.2012 Myoview: no isch/scar.  . Obese   . Dyslipidemia   . Diabetes mellitus   . History of arthroscopy   . 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 Plantar fascial fibromatosis   . Nonspecific abnormal results of liver function study   . Personal history of noncompliance with medical treatment, presenting hazards to health   . Unspecified essential hypertension   . Alcohol abuse   . Sexually transmitted disease   . Drug dependence   . Anxiety and depression   . Chronic pain     due to slipped disk, arthritis  . History of cardiac cath    Past Surgical History  Procedure Laterality Date  . Appendectomy    . Cardiac catheterization  2007   Family History  Problem Relation Age of Onset  .  Heart disease Mother 62  . Diabetes Mother   . Diabetes Brother    Social History  Substance Use Topics  . Smoking status: Former Smoker -- 1.00 packs/day for 11 years    Types: Cigarettes    Quit date: 05/24/1983  . Smokeless tobacco: Never Used  . Alcohol Use: No     Comment: previously alcohol use, none since 2005    Review of Systems  Constitutional: Negative for fever and diaphoresis.  HENT: Negative for sore throat.   Respiratory: Positive for shortness of breath. Negative for cough and hemoptysis.   Cardiovascular: Positive for chest pain. Negative  for palpitations and syncope.  Gastrointestinal: Negative for nausea, vomiting and abdominal pain.  Musculoskeletal: Negative for back pain.  Neurological: Negative for weakness and numbness.  All other systems reviewed and are negative.     Allergies  Shellfish allergy and Latex  Home Medications   Prior to Admission medications   Medication Sig Start Date End Date Taking? Authorizing Provider  aspirin 81 MG tablet Take 81 mg by mouth daily.   Yes Historical Provider, MD  atorvastatin (LIPITOR) 20 MG tablet Take 20 mg by mouth daily.     Yes Historical Provider, MD  buPROPion (WELLBUTRIN XL) 300 MG 24 hr tablet Take 300 mg by mouth daily. 08/25/14  Yes Historical Provider, MD  escitalopram (LEXAPRO) 10 MG tablet Take 30 mg by mouth daily.   Yes Historical Provider, MD  glimepiride (AMARYL) 4 MG tablet Take 4 mg by mouth at bedtime.    Yes Historical Provider, MD  latanoprost (XALATAN) 0.005 % ophthalmic solution Place 1 drop into both eyes at bedtime.  02/06/13  Yes Historical Provider, MD  metFORMIN (GLUCOPHAGE) 1000 MG tablet Take 1,000 mg by mouth 2 (two) times daily with a meal.   Yes Historical Provider, MD  oxybutynin (DITROPAN) 5 MG tablet Take 5 mg by mouth 2 (two) times daily. 08/04/14  Yes Historical Provider, MD  methocarbamol (ROBAXIN) 500 MG tablet Take 2 tablets (1,000 mg total) by mouth 4 (four) times daily as needed (Pain). Patient not taking: Reported on 01/26/2015 10/09/14   Elmyra Ricks Pisciotta, PA-C  triamterene-hydrochlorothiazide (MAXZIDE-25) 37.5-25 MG per tablet Take 0.5 tablets by mouth daily. Patient not taking: Reported on 10/09/2014 09/26/13   Fay Records, MD   BP 120/58 mmHg  Pulse 76  Temp(Src) 97.9 F (36.6 C) (Oral)  Resp 16  Ht 5\' 6"  (1.676 m)  Wt 275 lb (124.739 kg)  BMI 44.41 kg/m2  SpO2 99% Physical Exam  ED Course  Procedures (including critical care time) Labs Review Labs Reviewed  BASIC METABOLIC PANEL - Abnormal; Notable for the following:     Glucose, Bld 140 (*)    All other components within normal limits  CBG MONITORING, ED - Abnormal; Notable for the following:    Glucose-Capillary 124 (*)    All other components within normal limits  CBC  I-STAT TROPOININ, ED    Imaging Review Dg Chest 2 View  01/26/2015   CLINICAL DATA:  Shortness of breath. Left-sided chest pain. Symptoms started yesterday.  EXAM: CHEST  2 VIEW  COMPARISON:  08/04/2014  FINDINGS: Borderline enlargement of the cardiopericardial silhouette, without edema. The lungs appear clear. Slightly low lung volumes. No pleural effusion. Mild thoracic spondylosis.  IMPRESSION: 1. Borderline enlargement of the cardiopericardial silhouette, without edema. 2. Mild thoracic spondylosis.   Electronically Signed   By: Van Clines M.D.   On: 01/26/2015 12:14   I have personally reviewed  and evaluated these images and lab results as part of my medical decision-making.   EKG Interpretation   Date/Time:  Monday January 26 2015 11:43:08 EDT Ventricular Rate:  90 PR Interval:  186 QRS Duration: 99 QT Interval:  363 QTC Calculation: 444 R Axis:   70 Text Interpretation:  Sinus rhythm Normal ECG No significant change since  last tracing Confirmed by KNOTT MD, DANIEL ([id number]) on 01/26/2015 3:43:27 PM     Timi score 2  Patient is at low risk for cardiac ischemia.  His labs from today have been reviewed, as well as an EKG his chest x-ray, which are all noncontributory to exertional stabbing chest discomfort that resolves with rest.  Recommends a follow-up with his primary care physician, as well as keeping a written diary of any further episodes of chest discomfort, which is to include day activity level.  Her long it lasts.  He's been instructed to return if he develops chest pain with nausea, diaphoresis or shortness of breath or if he becomes concerned about his overall health MDM   Final diagnoses:  Chest pain on exertion         Junius Creamer, NP 01/26/15  1619  Leo Grosser, MD 01/28/15 [phone]

## 2015-01-26 NOTE — ED Notes (Signed)
Bed: PJ12 Expected date:  Expected time:  Means of arrival:  Comments: EMS- 54yo F, SOB

## 2015-01-26 NOTE — ED Notes (Signed)
Patient was alert, oriented and stable upon discharge. RN went over AVS and patient had no further questions. Pt ambulatory at d/c.

## 2015-01-26 NOTE — Discharge Instructions (Signed)
Chest Pain (Nonspecific) It is often hard to give a diagnosis for the cause of chest pain. There is always a chance that your pain could be related to something serious, such as a heart attack or a blood clot in the lungs. You need to follow up with your doctor. HOME CARE  If antibiotic medicine was given, take it as directed by your doctor. Finish the medicine even if you start to feel better.  For the next few days, avoid activities that bring on chest pain. Continue physical activities as told by your doctor.  Do not use any tobacco products. This includes cigarettes, chewing tobacco, and e-cigarettes.  Avoid drinking alcohol.  Only take medicine as told by your doctor.  Follow your doctor's suggestions for more testing if your chest pain does not go away.  Keep all doctor visits you made. GET HELP IF:  Your chest pain does not go away, even after treatment.  You have a rash with blisters on your chest.  You have a fever. GET HELP RIGHT AWAY IF:   You have more pain or pain that spreads to your arm, neck, jaw, back, or belly (abdomen).  You have shortness of breath.  You cough more than usual or cough up blood.  You have very bad back or belly pain.  You feel sick to your stomach (nauseous) or throw up (vomit).  You have very bad weakness.  You pass out (faint).  You have chills. This is an emergency. Do not wait to see if the problems will go away. Call your local emergency services (911 in [place] Do not drive yourself to the hospital. MAKE SURE YOU:   Understand these instructions.  Will watch your condition.  Will get help right away if you are not doing well or get worse. Document Released: 10/26/2007 Document Revised: 05/14/2013 Document Reviewed: 10/26/2007 [facility] Patient Information 2015 [place], [place]. This information is not intended to replace advice given to you by your health care provider. Make sure you discuss any questions you have with your  health care provider. Today your examination is inconsistent with cardiac chest pain.  Please make an appointment with your primary care physician for further evaluation.  Monitor your symptoms carefully and keep a diary of any further episodes of chest pain time of day, what you're doing along it lasts etc. return anytime you develop chest pain causes nausea, shortness of breath or sweatiness or you are concerned in any way, let your condition

## 2015-04-29 ENCOUNTER — Encounter: Payer: Self-pay | Admitting: Internal Medicine

## 2015-05-04 ENCOUNTER — Encounter: Payer: Self-pay | Admitting: Internal Medicine

## 2015-06-30 ENCOUNTER — Ambulatory Visit (AMBULATORY_SURGERY_CENTER): Payer: Self-pay | Admitting: *Deleted

## 2015-06-30 ENCOUNTER — Ambulatory Visit: Payer: Commercial Managed Care - HMO | Admitting: Pulmonary Disease

## 2015-06-30 VITALS — Ht 66.0 in | Wt 289.0 lb

## 2015-06-30 DIAGNOSIS — Z86010 Personal history of colonic polyps: Secondary | ICD-10-CM

## 2015-06-30 MED ORDER — NA SULFATE-K SULFATE-MG SULF 17.5-3.13-1.6 GM/177ML PO SOLN: 1.0000 | Freq: Once | ORAL | Status: DC

## 2015-06-30 NOTE — Progress Notes (Signed)
No egg or soy allergy known to patient  No issues with past sedation with any surgeries  or procedures, no intubation problems  No diet pills per patient No home 02 use per patient  No blood thinners per patient    

## 2015-07-14 ENCOUNTER — Ambulatory Visit (AMBULATORY_SURGERY_CENTER): Payer: Commercial Managed Care - HMO | Admitting: Internal Medicine

## 2015-07-14 ENCOUNTER — Encounter: Payer: Self-pay | Admitting: Internal Medicine

## 2015-07-14 VITALS — BP 114/68 | HR 83 | Temp 97.6°F | Resp 16 | Ht 66.0 in | Wt 289.0 lb

## 2015-07-14 DIAGNOSIS — D123 Benign neoplasm of transverse colon: Secondary | ICD-10-CM | POA: Diagnosis not present

## 2015-07-14 DIAGNOSIS — Z86010 Personal history of colonic polyps: Secondary | ICD-10-CM | POA: Diagnosis present

## 2015-07-14 LAB — GLUCOSE, CAPILLARY: GLUCOSE-CAPILLARY: 123 mg/dL — AB (ref 65–99)

## 2015-07-14 MED ORDER — SODIUM CHLORIDE 0.9 % IV SOLN: 500.0000 mL | INTRAVENOUS | Status: DC

## 2015-07-14 NOTE — Progress Notes (Signed)
No problems noted in the recovery room. maw 

## 2015-07-14 NOTE — Progress Notes (Signed)
No egg or soy allergy known to patient  No issues with past sedation with any surgeries  or procedures, no intubation problems  No diet pills per patient No home 02 use per patient  No blood thinners per patient    

## 2015-07-14 NOTE — Patient Instructions (Addendum)
YOU HAD AN ENDOSCOPIC PROCEDURE TODAY AT [facility]:   Refer to the procedure report that was given to you for any specific questions about what was found during the examination.  If the procedure report does not answer your questions, please call your gastroenterologist to clarify.  If you requested that your care partner not be given the details of your procedure findings, then the procedure report has been included in a sealed envelope for you to review at your convenience later.  YOU SHOULD EXPECT: Some feelings of bloating in the abdomen. Passage of more gas than usual.  Walking can help get rid of the air that was put into your GI tract during the procedure and reduce the bloating. If you had a lower endoscopy (such as a colonoscopy or flexible sigmoidoscopy) you may notice spotting of blood in your stool or on the toilet paper. If you underwent a bowel prep for your procedure, you may not have a normal bowel movement for a few days.  Please Note:  You might notice some irritation and congestion in your nose or some drainage.  This is from the oxygen used during your procedure.  There is no need for concern and it should clear up in a day or so.  SYMPTOMS TO REPORT IMMEDIATELY:   Following lower endoscopy (colonoscopy or flexible sigmoidoscopy):  Excessive amounts of blood in the stool  Significant tenderness or worsening of abdominal pains  Swelling of the abdomen that is new, acute  Fever of 100F or higher  For urgent or emergent issues, a gastroenterologist can be reached at any hour by calling [phone].   DIET: Your first meal following the procedure should be a small meal and then it is ok to progress to your normal diet. Heavy or fried foods are harder to digest and may make you feel nauseous or bloated.  Likewise, meals heavy in dairy and vegetables can increase bloating.  Drink plenty of fluids but you should avoid alcoholic beverages for 24  hours.  ACTIVITY:  You should plan to take it easy for the rest of today and you should NOT DRIVE or use heavy machinery until tomorrow (because of the sedation medicines used during the test).    FOLLOW UP: Our staff will call the number listed on your records the next business day following your procedure to check on you and address any questions or concerns that you may have regarding the information given to you following your procedure. If we do not reach you, we will leave a message.  However, if you are feeling well and you are not experiencing any problems, there is no need to return our call.  We will assume that you have returned to your regular daily activities without incident.  If any biopsies were taken you will be contacted by phone or by letter within the next 1-3 weeks.  Please call us at [phone] if you have not heard about the biopsies in 3 weeks.    SIGNATURES/CONFIDENTIALITY: You and/or your care partner have signed paperwork which will be entered into your electronic medical record.  These signatures attest to the fact that that the information above on your After Visit Summary has been reviewed and is understood.  Full responsibility of the confidentiality of this discharge information lies with you and/or your care-partner.    Handout was given to your care partner on polyps. You may resume your current medications today. Await biopsy results. Rescheduled colonoscopy and a  pre-visit for you.  Appointments are on the top of your discharge sheets. Your blood sugar was 123 in the recovery room. Please call if any questions or concerns.

## 2015-07-14 NOTE — Op Note (Signed)
[facility]  [address] [place], [address]   COLONOSCOPY PROCEDURE REPORT  PATIENT: Alexander Mack, Alexander Mack  MR#: [id number] BIRTHDATE: Dec 16, 1960 , 15  yrs. old GENDER: male ENDOSCOPIST: Jerene Bears, MD REFERRED [id number] Consuello Masse, M.D. PROCEDURE DATE:  07/14/2015 PROCEDURE:   Colonoscopy, surveillance and Colonoscopy with snare polypectomy First Screening Colonoscopy - Avg.  risk and is 50 yrs.  old or older - No.  Prior Negative Screening - Now for repeat screening. N/A  History of Adenoma - Now for follow-up colonoscopy & has been > or = to 3 yrs.  N/A  Polyps removed today? Yes ASA CLASS:   Class III INDICATIONS:Surveillance due to prior colonic neoplasia and PH Colon Adenoma, last colonoscopy 5 years ago. MEDICATIONS: Monitored anesthesia care and Propofol 300 mg IV  DESCRIPTION OF PROCEDURE:   After the risks benefits and alternatives of the procedure were thoroughly explained, informed consent was obtained.  The digital rectal exam revealed no rectal mass.   The LB [id number]  endoscope was introduced through the anus and advanced to the cecum, which was identified by the ileocecal valve. No adverse events experienced.   The quality of the prep was poor.  (Suprep was used)  The instrument was then slowly withdrawn as the colon was fully examined. Estimated blood loss is zero unless otherwise noted in this procedure report.   COLON FINDINGS: Two sessile polyps ranging from 5 to 1mm in size were found at the hepatic flexure and in the transverse colon. Polypectomies were performed with a cold snare.  The resection was complete, the polyp tissue was completely retrieved and sent to histology.   Solid and semisolid stool throughout the colon preventing adequate examination today.  Views very limited particularly in the right colon.  Retroflexed views revealed no abnormalities. The time to cecum = 8.1 Withdrawal time = 9.1   The scope was  withdrawn and the procedure completed. COMPLICATIONS: There were no immediate complications.  ENDOSCOPIC IMPRESSION: 1.   Two sessile polyps ranging from 5 to 43mm in size were found at the hepatic flexure and in the transverse colon; polypectomies were performed with a cold snare 2.   Solid and semisolid stool throughout the colon preventing adequate examination today.  Views very limited particularly in the right colon  RECOMMENDATIONS: 1.  Await pathology results 2.  Repeat colonoscopy next available with 2 day bowel preparation  eSigned:  Jerene Bears, MD 07/14/2015 10:03 AM   cc: the patient, PCP

## 2015-07-14 NOTE — Progress Notes (Signed)
Called to room to assist during endoscopic procedure.  Patient ID and intended procedure confirmed with present staff. Received instructions for my participation in the procedure from the performing physician.  

## 2015-07-14 NOTE — Progress Notes (Signed)
  [facility] Post-op Note  Patient: Alexander Mack  Procedure(s) Performed: colonoscopy  Patient Location: [facility] - Recovery Area  Anesthesia Type: Deep Sedation/Propofol  Level of Consciousness: awake, oriented and patient cooperative  Airway and Oxygen Therapy: Patient Spontanous Breathing  Post-op Pain: none  Post-op Assessment:  Post-op Vital signs reviewed, Patient's Cardiovascular Status Stable, Respiratory Function Stable, Patent Airway, No signs of Nausea or vomiting and Pain level controlled  Post-op Vital Signs: Reviewed and stable  Complications: No apparent anesthesia complications  Dustie Brittle E 9:58 AM

## 2015-07-15 ENCOUNTER — Telehealth: Payer: Self-pay

## 2015-07-15 NOTE — Telephone Encounter (Signed)
No answer, left vm to call back if concerns or questions following procedure.

## 2015-07-17 ENCOUNTER — Ambulatory Visit (AMBULATORY_SURGERY_CENTER): Payer: Self-pay | Admitting: *Deleted

## 2015-07-17 VITALS — Ht 66.0 in | Wt 291.0 lb

## 2015-07-17 DIAGNOSIS — Z86010 Personal history of colonic polyps: Secondary | ICD-10-CM

## 2015-07-17 MED ORDER — NA SULFATE-K SULFATE-MG SULF 17.5-3.13-1.6 GM/177ML PO SOLN: 1.0000 | Freq: Once | ORAL | Status: DC

## 2015-07-17 NOTE — Progress Notes (Signed)
No egg or soy allergy known to patient  No issues with past sedation with any surgeries  or procedures, no intubation problems  No diet pills per patient No home 02 use per patient  No blood thinners per patient   Pt had colon Tuesday 2-21 with suprep- right side of colon not cleaned out and pt had polyps so repeat per Dr Hilarie Fredrickson with 2 day prep sample suprep given  Samples of this drug were given to the patient, quantity ID:[id number], Lot Number [id number] marie PV

## 2015-07-20 ENCOUNTER — Encounter: Payer: Self-pay | Admitting: Internal Medicine

## 2015-07-23 ENCOUNTER — Ambulatory Visit (AMBULATORY_SURGERY_CENTER): Payer: Commercial Managed Care - HMO | Admitting: Internal Medicine

## 2015-07-23 ENCOUNTER — Encounter: Payer: Self-pay | Admitting: Internal Medicine

## 2015-07-23 VITALS — BP 118/82 | HR 72 | Temp 98.6°F | Resp 20 | Ht 66.0 in | Wt 286.0 lb

## 2015-07-23 DIAGNOSIS — Z86010 Personal history of colonic polyps: Secondary | ICD-10-CM | POA: Diagnosis not present

## 2015-07-23 DIAGNOSIS — K635 Polyp of colon: Secondary | ICD-10-CM

## 2015-07-23 DIAGNOSIS — D123 Benign neoplasm of transverse colon: Secondary | ICD-10-CM

## 2015-07-23 DIAGNOSIS — D124 Benign neoplasm of descending colon: Secondary | ICD-10-CM

## 2015-07-23 LAB — GLUCOSE, CAPILLARY
GLUCOSE-CAPILLARY: 138 mg/dL — AB (ref 65–99)
GLUCOSE-CAPILLARY: 120 mg/dL — AB (ref 65–99)

## 2015-07-23 MED ORDER — SODIUM CHLORIDE 0.9 % IV SOLN: 500.0000 mL | INTRAVENOUS | Status: DC

## 2015-07-23 NOTE — Patient Instructions (Signed)

## 2015-07-23 NOTE — Op Note (Signed)
[place]  [address] [place], [id number]   COLONOSCOPY PROCEDURE REPORT  PATIENT: Alexander, Mack  MR#: [id number] BIRTHDATE: [birth year] , 72  yrs. old GENDER: male ENDOSCOPIST: Jerene Bears, MD PROCEDURE DATE:  07/23/2015 PROCEDURE:   Colonoscopy, surveillance First Screening Colonoscopy - Avg.  risk and is 50 yrs.  old or older - No.  Prior Negative Screening - Now for repeat screening. N/A  History of Adenoma - Now for follow-up colonoscopy & has been > or = to 3 yrs.  No.  It has been less than 3 yrs since last colonoscopy.  Medical reason.  Polyps removed today? Yes ASA CLASS:   Class III INDICATIONS:repeat colonoscopy after poor prep, initial colonoscopy February 2017 with adenomatous polyps removed. MEDICATIONS: Monitored anesthesia care and Propofol 300 mg IV  DESCRIPTION OF PROCEDURE:   After the risks benefits and alternatives of the procedure were thoroughly explained, informed consent was obtained.  The digital rectal exam revealed no rectal mass.   The LB [id number]  endoscope was introduced through the anus and advanced to the cecum, which was identified by both the appendix and ileocecal valve. No adverse events experienced. The quality of the prep was good.  (Suprep was used)  The instrument was then slowly withdrawn as the colon was fully examined. Estimated blood loss is zero unless otherwise noted in this procedure report.  COLON FINDINGS: A sessile polyp measuring 3 mm in size was found in the transverse colon.  A polypectomy was performed with cold forceps.  The resection was complete, the polyp tissue was completely retrieved and sent to histology.   A sessile polyp measuring 5 mm in size was found in the descending colon.  A polypectomy was performed with a cold snare.  The resection was complete, the polyp tissue was completely retrieved and sent to histology.   The colon was redundant.  The patient was moved on to their  back to reach the cecum.  Retroflexed views revealed internal hemorrhoids. The time to cecum = 10.1 Withdrawal time = 11.8   The scope was withdrawn and the procedure completed. COMPLICATIONS: There were no immediate complications.  ENDOSCOPIC IMPRESSION: 1.   Sessile polyp was found in the transverse colon; polypectomy was performed with cold forceps 2.   Sessile polyp was found in the descending colon; polypectomy was performed with a cold snare  RECOMMENDATIONS: 1.  Await pathology results 2.  Repeat Colonoscopy in 3 years. 3.  You will receive a letter within 1-2 weeks with the results of your biopsy as well as final recommendations.  Please call my office if you have not received a letter after 3 weeks.  eSigned:  Jerene Bears, MD 07/23/2015 9:56 AM   cc:  the patient, PCP

## 2015-07-23 NOTE — Progress Notes (Signed)
Patient awakening,vss,report to rn 

## 2015-07-23 NOTE — Progress Notes (Signed)
Called to room to assist during endoscopic procedure.  Patient ID and intended procedure confirmed with present staff. Received instructions for my participation in the procedure from the performing physician.  

## 2015-07-24 ENCOUNTER — Telehealth: Payer: Self-pay

## 2015-07-24 NOTE — Telephone Encounter (Signed)
  Follow up Call-  Call back number 07/23/2015 07/14/2015  Post procedure Call Back phone  # - [phone]  Permission to leave phone message Yes Yes     Patient questions:  Do you have a fever, pain , or abdominal swelling? No. Pain Score  0 *  Have you tolerated food without any problems? Yes.    Have you been able to return to your normal activities? Yes.    Do you have any questions about your discharge instructions: Diet   No. Medications  No. Follow up visit  No.  Do you have questions or concerns about your Care? No.  Actions: * If pain score is 4 or above: No action needed, pain <4.  No problems noted per pt. maw

## 2015-07-29 ENCOUNTER — Encounter: Payer: Self-pay | Admitting: Internal Medicine

## 2015-10-08 ENCOUNTER — Encounter: Payer: Self-pay | Admitting: Pulmonary Disease

## 2015-10-08 ENCOUNTER — Ambulatory Visit (INDEPENDENT_AMBULATORY_CARE_PROVIDER_SITE_OTHER): Payer: Medicare HMO | Admitting: Pulmonary Disease

## 2015-10-08 VITALS — BP 114/64 | HR 86 | Ht 66.0 in | Wt 288.4 lb

## 2015-10-08 DIAGNOSIS — G4733 Obstructive sleep apnea (adult) (pediatric): Secondary | ICD-10-CM | POA: Diagnosis not present

## 2015-10-08 DIAGNOSIS — I10 Essential (primary) hypertension: Secondary | ICD-10-CM

## 2015-10-08 NOTE — Progress Notes (Signed)
   Subjective:    Patient ID: Alexander Mack, male    DOB: 1960/09/07, 55 y.o.   MRN: [id number]  HPI  Chief Complaint  Patient presents with  . Follow-up    former [facility] patient here for yearly OSA follow up - reports not wearing CPAP since last ov, concerned about water in the tubing.  did not call his DME.  reports sleep is "in and out"    55 year old obese hypertensive diagnosed with mild OSA in 2010 and maintained on CPAP since  On his last visit he noted that there was water coming into his tubing, this was not fixed and hence he stopped using his CPAP machine for the last year He reports excessive daytime fatigue and somnolence now ESS 13  Gained 20 lbs since PSG  He has a ResMed auto CPAP device and a Mirage fullface mask  NPSG 2010:  AHI 5/hr.  Pressure optimized to 13cm, unable to wear at that level.  Auto 10/2012:  Optimal pressure 13cm,  Review of Systems Patient denies significant dyspnea,cough, hemoptysis,  chest pain, palpitations, pedal edema, orthopnea, paroxysmal nocturnal dyspnea, lightheadedness, nausea, vomiting, abdominal or  leg pains      Objective:   Physical Exam  Gen. Pleasant, obese, in no distress ENT - no lesions, no post nasal drip Neck: No JVD, no thyromegaly, no carotid bruits Lungs: no use of accessory muscles, no dullness to percussion, decreased without rales or rhonchi  Cardiovascular: Rhythm regular, heart sounds  normal, no murmurs or gallops, no peripheral edema Musculoskeletal: No deformities, no cyanosis or clubbing , no tremors       Assessment & Plan:

## 2015-10-08 NOTE — Patient Instructions (Signed)
We will schedule a home sleep study to see sleep apnea has improved Weight loss would help her condition We adjusted humidifier dialed down Get back on CPAP machine-check download in one month-sleep management solutions

## 2015-10-08 NOTE — Assessment & Plan Note (Signed)
We will schedule a home sleep study to see sleep apnea has improved Weight loss would help her condition We adjusted humidifier dialed down -I showed him how Get back on CPAP machine-check download in one month-sleep management solutions  Weight loss encouraged, compliance with goal of at least 4-6 hrs every night is the expectation. Advised against medications with sedative side effects Cautioned against driving when sleepy - understanding that sleepiness will vary on a day to day basis

## 2015-10-08 NOTE — Assessment & Plan Note (Signed)
Benefits of CPAP discussed

## 2015-10-29 ENCOUNTER — Telehealth: Payer: Self-pay | Admitting: Pulmonary Disease

## 2015-10-29 NOTE — Telephone Encounter (Signed)
Per Dr. Elsworth Soho, compliance report from 10/16/15 shows poor usage, Dr. Elsworth Soho would like to know why?  ----------------------- Attempted to contact patient, unable to leave message, no voicemail, will call back.

## 2015-11-03 DIAGNOSIS — G4733 Obstructive sleep apnea (adult) (pediatric): Secondary | ICD-10-CM | POA: Diagnosis not present

## 2015-11-03 NOTE — Telephone Encounter (Signed)
lmtcb x1 for pt. 

## 2015-11-05 NOTE — Telephone Encounter (Signed)
Attempted to contact patient, could not leave message, voicemail is not activated.  Will call back.

## 2015-11-09 DIAGNOSIS — G4733 Obstructive sleep apnea (adult) (pediatric): Secondary | ICD-10-CM | POA: Diagnosis not present

## 2015-11-09 NOTE — Telephone Encounter (Signed)
Home sleep study still shows moderate OSA AHI 23/hour He really needs to get back on his CPAP machine and use it at least 6 hours every night

## 2015-11-10 ENCOUNTER — Other Ambulatory Visit: Payer: Self-pay | Admitting: *Deleted

## 2015-11-10 DIAGNOSIS — G4733 Obstructive sleep apnea (adult) (pediatric): Secondary | ICD-10-CM

## 2015-11-10 DIAGNOSIS — I10 Essential (primary) hypertension: Secondary | ICD-10-CM

## 2015-11-11 NOTE — Telephone Encounter (Signed)
Patient notified of sleep study results. Patient states he will try to get back on his CPAP machine. Nothing further needed.

## 2016-06-16 ENCOUNTER — Ambulatory Visit: Payer: Medicare Other | Attending: Orthopedic Surgery

## 2016-06-24 ENCOUNTER — Encounter (HOSPITAL_COMMUNITY): Payer: Self-pay

## 2016-06-24 ENCOUNTER — Emergency Department (HOSPITAL_COMMUNITY)
Admission: EM | Admit: 2016-06-24 | Discharge: 2016-06-24 | Disposition: A | Discharge status: Home / Self Care | Payer: Medicare Other | Attending: Emergency Medicine | Admitting: Emergency Medicine

## 2016-06-24 DIAGNOSIS — Z7982 Long term (current) use of aspirin: Secondary | ICD-10-CM | POA: Diagnosis not present

## 2016-06-24 DIAGNOSIS — Z87891 Personal history of nicotine dependence: Secondary | ICD-10-CM | POA: Insufficient documentation

## 2016-06-24 DIAGNOSIS — Z91040 Latex allergy status: Secondary | ICD-10-CM | POA: Diagnosis not present

## 2016-06-24 DIAGNOSIS — Z79899 Other long term (current) drug therapy: Secondary | ICD-10-CM | POA: Diagnosis not present

## 2016-06-24 DIAGNOSIS — Z7984 Long term (current) use of oral hypoglycemic drugs: Secondary | ICD-10-CM | POA: Insufficient documentation

## 2016-06-24 DIAGNOSIS — I10 Essential (primary) hypertension: Secondary | ICD-10-CM | POA: Insufficient documentation

## 2016-06-24 DIAGNOSIS — M545 Low back pain, unspecified: Secondary | ICD-10-CM

## 2016-06-24 DIAGNOSIS — E119 Type 2 diabetes mellitus without complications: Secondary | ICD-10-CM | POA: Insufficient documentation

## 2016-06-24 DIAGNOSIS — I2510 Atherosclerotic heart disease of native coronary artery without angina pectoris: Secondary | ICD-10-CM | POA: Diagnosis not present

## 2016-06-24 MED ORDER — HYDROMORPHONE HCL 2 MG/ML IJ SOLN
2.0000 mg | Freq: Once | INTRAMUSCULAR | Status: AC
  Administered 2016-06-24: 2 mg via INTRAMUSCULAR
  Filled 2016-06-24: qty 1

## 2016-06-24 MED ORDER — KETOROLAC TROMETHAMINE 60 MG/2ML IM SOLN
60.0000 mg | Freq: Once | INTRAMUSCULAR | Status: AC
  Administered 2016-06-24: 60 mg via INTRAMUSCULAR
  Filled 2016-06-24: qty 2

## 2016-06-24 MED ORDER — ONDANSETRON 4 MG PO TBDP
8.0000 mg | ORAL_TABLET | Freq: Once | ORAL | Status: AC
  Administered 2016-06-24: 8 mg via ORAL
  Filled 2016-06-24: qty 2

## 2016-06-24 MED ORDER — METHOCARBAMOL 500 MG PO TABS: 500.0000 mg | ORAL_TABLET | Freq: Two times a day (BID) | ORAL | 0 refills | Status: DC

## 2016-06-24 NOTE — ED Provider Notes (Signed)
[place] DEPT Provider Note   CSN: [id number] Arrival date & time: 06/24/16  0534     History   Chief Complaint Chief Complaint  Patient presents with  . Back Pain    HPI Alexander Mack is a 56 y.o. male.  Patient presents to the emergency department for evaluation of lower back pain. Patient has been experiencing ongoing issues with lower back pain for some time and symptoms have worsened in the last 2 weeks. He has known disc problems at L4 and L5. Patient has been seeing his doctor and a specialist, receiving injections for this. Patient reports has been taking his oral medication but overnight he was unable to sleep because the pain has been severe. No change in bowel or bladder function. No numbness, tingling or weakness in lower extremities.      Past Medical History:  Diagnosis Date  . Alcohol abuse   . Anxiety   . Anxiety and depression   . Arthritis   . CAD (coronary artery disease)    a. 11.2007 Cath: LM 51mm, nl, LAD 30-46m, D2 30-40, LCX 20-30p, RCA nl, EF 50%;  b. 10.2012 Myoview: no isch/scar.  . Cataract   . Chronic pain    due to slipped disk, arthritis  . Depression   . Diabetes mellitus   . 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 Drug dependence   . Dyslipidemia   . Glaucoma   . History of arthroscopy   . History of cardiac cath   . Nonspecific abnormal results of liver function study   . Obese   . Personal history of noncompliance with medical treatment, presenting hazards to health   . Plantar fascial fibromatosis   . Sexually transmitted disease   . Sleep apnea    wears cpap  . Unspecified essential hypertension     Patient Active Problem List   Diagnosis Date Noted  . Angina at rest [facility]) 01/17/2013  . Obstructive sleep apnea 12/24/2008  . HYPERLIPIDEMIA-MIXED 11/05/2008  . Obesity, unspecified 11/05/2008  . CAD, NATIVE VESSEL 11/05/2008  . SEXUALLY TRANSMITTED DISEASE 11/01/2006  . DM 11/01/2006  .  DRUG DEPENDENCE 11/01/2006  . ABUSE, ALCOHOL, UNSPECIFIED 11/01/2006  . Essential hypertension 11/01/2006  . DISPLACEMENT, LUMBAR DISC W/O MYELOPATHY 11/01/2006  . PLANTAR FASCIITIS 11/01/2006  . ABNORMAL RESULT, FUNCTION STUDY, LIVER 11/01/2006  . HX, PERSONAL, PAST NONCOMPLIANCE 11/01/2006  . ARTHROSCOPY, HX OF 11/01/2006    Past Surgical History:  Procedure Laterality Date  . APPENDECTOMY    . CARDIAC CATHETERIZATION  2007  . COLONOSCOPY    . POLYPECTOMY         Home Medications    Prior to Admission medications   Medication Sig Start Date End Date Taking? Authorizing Provider  aspirin 81 MG tablet Take 81 mg by mouth daily. Reported on 07/23/2015    Historical Provider, MD  atorvastatin (LIPITOR) 20 MG tablet Take 20 mg by mouth daily.      Historical Provider, MD  buPROPion (WELLBUTRIN XL) 300 MG 24 hr tablet Take 300 mg by mouth daily. 08/25/14   Historical Provider, MD  busPIRone (BUSPAR) 10 MG tablet Take 10 mg by mouth 2 (two) times daily.  07/16/15   Historical Provider, MD  ergocalciferol (VITAMIN D2) 50000 units capsule Take 50,000 Units by mouth once a week. Reported on 10/08/2015    Historical Provider, MD  escitalopram (LEXAPRO) 10 MG tablet Take 20 mg by mouth daily.     Historical Provider, MD  glimepiride (AMARYL) 4 MG tablet Take 4 mg by mouth at bedtime.     Historical Provider, MD  HYDROcodone-acetaminophen (NORCO/VICODIN) 5-325 MG tablet Take 1 tablet by mouth every 6 (six) hours as needed for moderate pain. Reported on 07/23/2015    Historical Provider, MD  metFORMIN (GLUCOPHAGE) 1000 MG tablet Take 1,000 mg by mouth 2 (two) times daily with a meal.    Historical Provider, MD  methocarbamol (ROBAXIN) 500 MG tablet Take 2 tablets (1,000 mg total) by mouth 4 (four) times daily as needed (Pain). 10/09/14   Nicole Pisciotta, PA-C  oxybutynin (DITROPAN) 5 MG tablet Take 5 mg by mouth 2 (two) times daily. 08/04/14   Historical Provider, MD  tadalafil (CIALIS) 5 MG tablet  Take 5 mg by mouth daily as needed for erectile dysfunction.    Historical Provider, MD    Family History Family History  Problem Relation Age of Onset  . Heart disease Mother 56  . Diabetes Mother   . Diabetes Brother   . Stomach cancer Brother   . Colon polyps Maternal Aunt   . Prostate cancer Paternal Grandmother   . Colon cancer Neg Hx   . Rectal cancer Neg Hx     Social History Social History  Substance Use Topics  . Smoking status: Former Smoker    Packs/day: 1.00    Years: 11.00    Types: Cigarettes    Quit date: 05/24/1983  . Smokeless tobacco: Former Systems developer    Quit date: 05/24/1988  . Alcohol use No     Comment: previously alcohol use, none since 2005     Allergies   Shellfish allergy and Latex   Review of Systems Review of Systems  Musculoskeletal: Positive for back pain.  All other systems reviewed and are negative.    Physical Exam Updated Vital Signs BP 132/82   Pulse 95   Temp 97.8 F (36.6 C) (Oral)   Resp 18   Ht 5\' 6"  (1.676 m)   Wt 290 lb (131.5 kg)   SpO2 98%   BMI 46.81 kg/m   Physical Exam  Constitutional: He is oriented to person, place, and time. He appears well-developed and well-nourished. No distress.  HENT:  Head: Normocephalic and atraumatic.  Right Ear: Hearing normal.  Left Ear: Hearing normal.  Nose: Nose normal.  Mouth/Throat: Oropharynx is clear and moist and mucous membranes are normal.  Eyes: Conjunctivae and EOM are normal. Pupils are equal, round, and reactive to light.  Neck: Normal range of motion. Neck supple.  Cardiovascular: Regular rhythm, S1 normal and S2 normal.  Exam reveals no gallop and no friction rub.   No murmur heard. Pulmonary/Chest: Effort normal and breath sounds normal. No respiratory distress. He exhibits no tenderness.  Abdominal: Soft. Normal appearance and bowel sounds are normal. There is no hepatosplenomegaly. There is no tenderness. There is no rebound, no guarding, no tenderness at McBurney's  point and negative Murphy's sign. No hernia.  Musculoskeletal: Normal range of motion.       Lumbar back: He exhibits tenderness.       Back:  Neurological: He is alert and oriented to person, place, and time. He has normal strength. No cranial nerve deficit or sensory deficit. Coordination normal. GCS eye subscore is 4. GCS verbal subscore is 5. GCS motor subscore is 6.  Skin: Skin is warm, dry and intact. No rash noted. No cyanosis.  Psychiatric: He has a normal mood and affect. His speech is normal and behavior is normal. Thought  content normal.  Nursing note and vitals reviewed.    ED Treatments / Results  Labs (all labs ordered are listed, but only abnormal results are displayed) Labs Reviewed - No data to display  EKG  EKG Interpretation None       Radiology No results found.  Procedures Procedures (including critical care time)  Medications Ordered in ED Medications  ketorolac (TORADOL) injection 60 mg (60 mg Intramuscular Given 06/24/16 0631)  HYDROmorphone (DILAUDID) injection 2 mg (2 mg Intramuscular Given 06/24/16 0631)  ondansetron (ZOFRAN-ODT) disintegrating tablet 8 mg (8 mg Oral Given 06/24/16 0631)     Initial Impression / Assessment and Plan / ED Course  I have reviewed the triage vital signs and the nursing notes.  Pertinent labs & imaging results that were available during my care of the patient were reviewed by me and considered in my medical decision making (see chart for details).     Patient presents to the ER with musculoskeletal back pain. Examination reveals back tenderness without any associated neurologic findings. Patient's strength, sensation and reflexes were normal. There is no evidence of saddle anesthesia. Patient does not have a foot drop. Patient has not experienced any change in bowel or bladder function. As such, patient did not require any imaging or further studies. Patient was treated with analgesia.  Final Clinical Impressions(s) / ED  Diagnoses   Final diagnoses:  Acute midline low back pain without sciatica    New Prescriptions New Prescriptions   No medications on file     Orpah Greek, MD 06/24/16 [phone]

## 2016-06-24 NOTE — ED Notes (Signed)
ED Provider at bedside. 

## 2016-06-24 NOTE — ED Triage Notes (Signed)
Pt states that he has had lower back pain for the past two weeks, denies injury known compression fracture to L4 and L5

## 2016-09-19 ENCOUNTER — Telehealth: Payer: Self-pay | Admitting: Pulmonary Disease

## 2016-09-19 DIAGNOSIS — G4733 Obstructive sleep apnea (adult) (pediatric): Secondary | ICD-10-CM

## 2016-09-19 NOTE — Telephone Encounter (Signed)
Dr. Elsworth Soho  Please Advise-  Pt called and stated his cpap was destroyed in the [facility] and he needs a new machine and wanted to know if an order could be placed. Pt has a follow up appt with TP 10/07/16.

## 2016-09-19 NOTE — Telephone Encounter (Signed)
Rx for CPAP 13 cm Download in 4 wks Pl let him know that if compliance is poor, CPAP will be taken away

## 2016-09-20 NOTE — Telephone Encounter (Signed)
atc pt, vm has not been set up yet

## 2016-09-21 NOTE — Telephone Encounter (Signed)
DME>[facility] DX for new cpap 13 cm---pts machine was [place] Pt will need download in 4 weeks. Pt is aware to use the cpap daily.  DX:  [facility]     Called and spoke with pt and he is aware of order that has been placed to [facility]. Nothing further is needed.

## 2016-10-07 ENCOUNTER — Ambulatory Visit: Payer: Medicare HMO | Admitting: Adult Health

## 2017-02-28 ENCOUNTER — Emergency Department (HOSPITAL_COMMUNITY)
Admission: EM | Admit: 2017-02-28 | Discharge: 2017-02-28 | Disposition: A | Discharge status: Home / Self Care | Payer: Medicare Other | Attending: Emergency Medicine | Admitting: Emergency Medicine

## 2017-02-28 ENCOUNTER — Encounter (HOSPITAL_COMMUNITY): Payer: Self-pay | Admitting: Emergency Medicine

## 2017-02-28 DIAGNOSIS — Z87891 Personal history of nicotine dependence: Secondary | ICD-10-CM | POA: Insufficient documentation

## 2017-02-28 DIAGNOSIS — Z91040 Latex allergy status: Secondary | ICD-10-CM | POA: Insufficient documentation

## 2017-02-28 DIAGNOSIS — I2510 Atherosclerotic heart disease of native coronary artery without angina pectoris: Secondary | ICD-10-CM | POA: Insufficient documentation

## 2017-02-28 DIAGNOSIS — Z7982 Long term (current) use of aspirin: Secondary | ICD-10-CM | POA: Insufficient documentation

## 2017-02-28 DIAGNOSIS — E119 Type 2 diabetes mellitus without complications: Secondary | ICD-10-CM | POA: Insufficient documentation

## 2017-02-28 DIAGNOSIS — M79622 Pain in left upper arm: Secondary | ICD-10-CM | POA: Diagnosis not present

## 2017-02-28 DIAGNOSIS — I10 Essential (primary) hypertension: Secondary | ICD-10-CM | POA: Diagnosis not present

## 2017-02-28 DIAGNOSIS — Z7984 Long term (current) use of oral hypoglycemic drugs: Secondary | ICD-10-CM | POA: Insufficient documentation

## 2017-02-28 DIAGNOSIS — Z79899 Other long term (current) drug therapy: Secondary | ICD-10-CM | POA: Diagnosis not present

## 2017-02-28 DIAGNOSIS — M79602 Pain in left arm: Secondary | ICD-10-CM

## 2017-02-28 MED ORDER — PREDNISONE 10 MG (21) PO TBPK: ORAL_TABLET | ORAL | 0 refills | Status: DC

## 2017-02-28 MED ORDER — KETOROLAC TROMETHAMINE 60 MG/2ML IM SOLN
60.0000 mg | Freq: Once | INTRAMUSCULAR | Status: AC
  Administered 2017-02-28: 60 mg via INTRAMUSCULAR
  Filled 2017-02-28: qty 2

## 2017-02-28 NOTE — Discharge Instructions (Signed)
You have been seen today for arm pain.  Pain: Take 600 mg of ibuprofen every 6 hours or 440 mg (over the counter dose) to 500 mg (prescription dose) of naproxen every 12 hours for the next 3 days. After this time, these medications may be used as needed for pain. Take these medications with food to avoid upset stomach. Choose only one of these medications, do not take them together.  Tylenol: Should you continue to have additional pain while taking the ibuprofen or naproxen, you may add in tylenol as needed. Your daily total maximum amount of tylenol from all sources should be limited to 4000mg /day for persons without liver problems, or 2000mg /day for those with liver problems. Prednisone: Take the prednisone, as prescribed, until gone. Note that the prednisone can temporarily increase your blood sugar. Ice: May apply ice to the area over the next 24 hours for 15 minutes at a time to reduce swelling. Elevation: Keep the extremity elevated as often as possible to reduce pain and inflammation. Exercises: Start by performing these exercises a few times a week, increasing the frequency until you are performing them twice daily.  Follow up: If symptoms are improving, you may follow up with your primary care provider for any continued management. If symptoms are not improving, you may follow up with the orthopedic specialist.

## 2017-02-28 NOTE — ED Triage Notes (Signed)
Patient reports left upper arm pain onset last week , pain increases with movement and changing positions .

## 2017-02-28 NOTE — ED Provider Notes (Signed)
[place] DEPT Provider Note   CSN: [id number] Arrival date & time: 02/28/17  1912     History   Chief Complaint Chief Complaint  Patient presents with  . Arm Pain    HPI Alexander Mack is a 56 y.o. male.  HPI   Alexander Mack is a 56 y.o. male, with a history of HTN, alcohol abuse, CAD, DM, presenting to the ED with left upper arm pain for the last week. Has been increasingly lifting weights recently, which is new for hiim. Aching, 8/10, nonradiating, worse with arm outstretch and pushing outward. Patient denies specifically known injury, numbness, weakness, fever, swelling, or any other complaints.   Past Medical History:  Diagnosis Date  . Alcohol abuse   . Anxiety   . Anxiety and depression   . Arthritis   . CAD (coronary artery disease)    a. 11.2007 Cath: LM 25mm, nl, LAD 30-81m, D2 30-40, LCX 20-30p, RCA nl, EF 50%;  b. 10.2012 Myoview: no isch/scar.  . Cataract   . Chronic pain    due to slipped disk, arthritis  . Depression   . Diabetes mellitus   . 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 Drug dependence   . Dyslipidemia   . Glaucoma   . History of arthroscopy   . History of cardiac cath   . Nonspecific abnormal results of liver function study   . Obese   . Personal history of noncompliance with medical treatment, presenting hazards to health   . Plantar fascial fibromatosis   . Sexually transmitted disease   . Sleep apnea    wears cpap  . Unspecified essential hypertension     Patient Active Problem List   Diagnosis Date Noted  . Angina at rest [facility]) 01/17/2013  . Obstructive sleep apnea 12/24/2008  . HYPERLIPIDEMIA-MIXED 11/05/2008  . Obesity, unspecified 11/05/2008  . CAD, NATIVE VESSEL 11/05/2008  . SEXUALLY TRANSMITTED DISEASE 11/01/2006  . DM 11/01/2006  . DRUG DEPENDENCE 11/01/2006  . ABUSE, ALCOHOL, UNSPECIFIED 11/01/2006  . Essential hypertension 11/01/2006  . DISPLACEMENT, LUMBAR DISC W/O  MYELOPATHY 11/01/2006  . PLANTAR FASCIITIS 11/01/2006  . ABNORMAL RESULT, FUNCTION STUDY, LIVER 11/01/2006  . HX, PERSONAL, PAST NONCOMPLIANCE 11/01/2006  . ARTHROSCOPY, HX OF 11/01/2006    Past Surgical History:  Procedure Laterality Date  . APPENDECTOMY    . CARDIAC CATHETERIZATION  2007  . COLONOSCOPY    . POLYPECTOMY         Home Medications    Prior to Admission medications   Medication Sig Start Date End Date Taking? Authorizing Provider  aspirin 81 MG tablet Take 81 mg by mouth daily. Reported on 07/23/2015    [provider]  atorvastatin (LIPITOR) 20 MG tablet Take 20 mg by mouth daily.      [provider]  buPROPion (WELLBUTRIN XL) 300 MG 24 hr tablet Take 300 mg by mouth daily. 08/25/14   [provider]  busPIRone (BUSPAR) 10 MG tablet Take 10 mg by mouth 2 (two) times daily.  07/16/15   [provider]  ergocalciferol (VITAMIN D2) 50000 units capsule Take 50,000 Units by mouth once a week. Reported on 10/08/2015    [provider]  escitalopram (LEXAPRO) 10 MG tablet Take 20 mg by mouth daily.     [provider]  glimepiride (AMARYL) 4 MG tablet Take 4 mg by mouth at bedtime.     [provider]  HYDROcodone-acetaminophen (NORCO/VICODIN) 5-325 MG tablet Take 1  tablet by mouth every 6 (six) hours as needed for moderate pain. Reported on 07/23/2015    [provider]  metFORMIN (GLUCOPHAGE) 1000 MG tablet Take 1,000 mg by mouth 2 (two) times daily with a meal.    [provider]  methocarbamol (ROBAXIN) 500 MG tablet Take 1 tablet (500 mg total) by mouth 2 (two) times daily. 06/24/16   Orpah Greek, MD  oxybutynin (DITROPAN) 5 MG tablet Take 5 mg by mouth 2 (two) times daily. 08/04/14   [provider]  predniSONE (STERAPRED UNI-PAK 21 TAB) 10 MG (21) TBPK tablet Take 6 tabs (60mg ) on day 1, 5 tabs (50mg ) on day 2, 4 tabs (40mg ) on day 3, 3 tabs (30mg ) on day 4, 2 tabs (20mg ) on day  5, and 1 tab (10mg ) on day 6. 02/28/17   Joy, Shawn C, PA-C  tadalafil (CIALIS) 5 MG tablet Take 5 mg by mouth daily as needed for erectile dysfunction.    [provider]    Family History Family History  Problem Relation Age of Onset  . Heart disease Mother 31  . Diabetes Mother   . Diabetes Brother   . Stomach cancer Brother   . Colon polyps Maternal Aunt   . Prostate cancer Paternal Grandmother   . Colon cancer Neg Hx   . Rectal cancer Neg Hx     Social History Social History  Substance Use Topics  . Smoking status: Former Smoker    Packs/day: 1.00    Years: 11.00    Types: Cigarettes    Quit date: 05/24/1983  . Smokeless tobacco: Former Systems developer    Quit date: 05/24/1988  . Alcohol use No     Comment: previously alcohol use, none since 2005     Allergies   Shellfish allergy and Latex   Review of Systems Review of Systems  Constitutional: Negative for fever.  Musculoskeletal: Positive for myalgias.  Neurological: Negative for weakness and numbness.     Physical Exam Updated Vital Signs BP 140/85 (BP Location: Right Arm)   Pulse 87   Temp 98 F (36.7 C) (Oral)   Resp 18   SpO2 96%   Physical Exam  Constitutional: He appears well-developed and well-nourished. No distress.  HENT:  Head: Normocephalic and atraumatic.  Eyes: Conjunctivae are normal.  Neck: Neck supple.  Cardiovascular: Normal rate, regular rhythm and intact distal pulses.   Pulmonary/Chest: Effort normal.  Musculoskeletal: He exhibits no edema, tenderness or deformity.  No tenderness, erythema, swelling, instability, crepitus, or deformity. Full passive and active range of motion in left shoulder. Pain elicited over the proximal attachment of the biceps tendon with empty can test and with pushing arms outward against a surface.  Neurological: He is alert.  No noted sensory deficits in the left upper extremity. Strength 5/5 in each of the cardinal directions of the left shoulder as well as  in the left elbow and wrist. Equal grip strengths.  Skin: Skin is warm and dry. Capillary refill takes less than 2 seconds. He is not diaphoretic. No pallor.  Psychiatric: He has a normal mood and affect. His behavior is normal.  Nursing note and vitals reviewed.    ED Treatments / Results  Labs (all labs ordered are listed, but only abnormal results are displayed) Labs Reviewed - No data to display  EKG  EKG Interpretation None       Radiology No results found.  Procedures Procedures (including critical care time)  Medications Ordered in ED Medications  ketorolac (TORADOL) injection 60 mg (60 mg Intramuscular Given 02/28/17 2326)     Initial Impression / Assessment and Plan / ED Course  I have reviewed the triage vital signs and the nursing notes.  Pertinent labs & imaging results that were available during my care of the patient were reviewed by me and considered in my medical decision making (see chart for details).     Patient presents with left upper arm pain in the setting of new onset of weight training. No noted neuro or functional deficits. PCP versus orthopedic follow-up. Shared decision-making was used regarding the use of short-term prednisone. Patient elected for this therapy. The patient was given instructions for home care as well as return precautions. Patient voices understanding of these instructions, accepts the plan, and is comfortable with discharge.     Final Clinical Impressions(s) / ED Diagnoses   Final diagnoses:  Left arm pain    New Prescriptions New Prescriptions   PREDNISONE (STERAPRED UNI-PAK 21 TAB) 10 MG (21) TBPK TABLET    Take 6 tabs (60mg ) on day 1, 5 tabs (50mg ) on day 2, 4 tabs (40mg ) on day 3, 3 tabs (30mg ) on day 4, 2 tabs (20mg ) on day 5, and 1 tab (10mg ) on day 6.     Lorayne Bender, PA-C 02/28/17 2342    Davonna Belling, MD 03/01/17 1451

## 2017-05-17 ENCOUNTER — Emergency Department (HOSPITAL_COMMUNITY)
Admission: EM | Admit: 2017-05-17 | Discharge: 2017-05-17 | Disposition: A | Discharge status: Home / Self Care | Payer: Medicare Other | Attending: Emergency Medicine | Admitting: Emergency Medicine

## 2017-05-17 ENCOUNTER — Encounter (HOSPITAL_COMMUNITY): Payer: Self-pay | Admitting: Emergency Medicine

## 2017-05-17 DIAGNOSIS — E119 Type 2 diabetes mellitus without complications: Secondary | ICD-10-CM | POA: Insufficient documentation

## 2017-05-17 DIAGNOSIS — M546 Pain in thoracic spine: Secondary | ICD-10-CM | POA: Diagnosis not present

## 2017-05-17 DIAGNOSIS — I2510 Atherosclerotic heart disease of native coronary artery without angina pectoris: Secondary | ICD-10-CM | POA: Diagnosis not present

## 2017-05-17 DIAGNOSIS — Z7984 Long term (current) use of oral hypoglycemic drugs: Secondary | ICD-10-CM | POA: Diagnosis not present

## 2017-05-17 DIAGNOSIS — Z87891 Personal history of nicotine dependence: Secondary | ICD-10-CM | POA: Diagnosis not present

## 2017-05-17 DIAGNOSIS — Z91040 Latex allergy status: Secondary | ICD-10-CM | POA: Diagnosis not present

## 2017-05-17 DIAGNOSIS — Z7982 Long term (current) use of aspirin: Secondary | ICD-10-CM | POA: Insufficient documentation

## 2017-05-17 MED ORDER — PREDNISONE 20 MG PO TABS
60.0000 mg | ORAL_TABLET | Freq: Once | ORAL | Status: AC
  Administered 2017-05-17: 60 mg via ORAL
  Filled 2017-05-17: qty 3

## 2017-05-17 MED ORDER — PREDNISONE 10 MG PO TABS: 40.0000 mg | ORAL_TABLET | Freq: Every day | ORAL | 0 refills | Status: AC

## 2017-05-17 NOTE — ED Provider Notes (Signed)
[place] EMERGENCY DEPARTMENT Provider Note   CSN: [id number] Arrival date & time: 05/17/17  1632     History   Chief Complaint Chief Complaint  Patient presents with  . Back Pain    HPI Alexander Mack is a 56 y.o. male with history of DM, CAD, HLD, HTN presents today with chief complaint acute onset, constant right-sided thoracic back pain for 2 days.  He states that he was lifting weights and thinks he may have injured his back in the process.  He states that this feels exactly like the last time he injured his back doing similar activities.  The pain is sharp, does not radiate, and worsens with bending and twisting as well as palpation.  Also worsens with upward movement of the right upper extremity.  He has not tried anything for his symptoms at this time.  He states that when his back usually flares up he will go to his orthopedic physician at [place] and he will get a steroid shot with improvement in his symptoms.  He states his diabetes is well controlled and that his last hemoglobin A1c was within normal limits.  He states that he has never had a spike in his blood sugar with a steroid pack or steroid injection.  He states the last time he was seen in the ED for similar symptoms he was given a steroid pack with improvement in his symptoms.  He denies numbness, tingling, weakness, saddle anesthesia, low back pain, fevers, chills, or bowel or bladder incontinence.  Denies shortness of breath or chest pain.  The history is provided by the patient.    Past Medical History:  Diagnosis Date  . Alcohol abuse   . Anxiety   . Anxiety and depression   . Arthritis   . CAD (coronary artery disease)    a. 11.2007 Cath: LM 76mm, nl, LAD 30-67m, D2 30-40, LCX 20-30p, RCA nl, EF 50%;  b. 10.2012 Myoview: no isch/scar.  . Cataract   . Chronic pain    due to slipped disk, arthritis  . Depression   . Diabetes mellitus   . Displacement of lumbar  intervertebral disc without myelopathy   . Drug dependence   . Dyslipidemia   . Glaucoma   . History of arthroscopy   . History of cardiac cath   . Nonspecific abnormal results of liver function study   . Obese   . Personal history of noncompliance with medical treatment, presenting hazards to health   . Plantar fascial fibromatosis   . Sexually transmitted disease   . Sleep apnea    wears cpap  . Unspecified essential hypertension     Patient Active Problem List   Diagnosis Date Noted  . Angina at rest [facility]) 01/17/2013  . Obstructive sleep apnea 12/24/2008  . HYPERLIPIDEMIA-MIXED 11/05/2008  . Obesity, unspecified 11/05/2008  . CAD, NATIVE VESSEL 11/05/2008  . SEXUALLY TRANSMITTED DISEASE 11/01/2006  . DM 11/01/2006  . DRUG DEPENDENCE 11/01/2006  . ABUSE, ALCOHOL, UNSPECIFIED 11/01/2006  . Essential hypertension 11/01/2006  . DISPLACEMENT, LUMBAR DISC W/O MYELOPATHY 11/01/2006  . PLANTAR FASCIITIS 11/01/2006  . ABNORMAL RESULT, FUNCTION STUDY, LIVER 11/01/2006  . HX, PERSONAL, PAST NONCOMPLIANCE 11/01/2006  . ARTHROSCOPY, HX OF 11/01/2006    Past Surgical History:  Procedure Laterality Date  . APPENDECTOMY    . CARDIAC CATHETERIZATION  2007  . COLONOSCOPY    . POLYPECTOMY         Home Medications    Prior  to Admission medications   Medication Sig Start Date End Date Taking? Authorizing Provider  aspirin 81 MG tablet Take 81 mg by mouth daily. Reported on 07/23/2015    [provider]  atorvastatin (LIPITOR) 20 MG tablet Take 20 mg by mouth daily.      [provider]  buPROPion (WELLBUTRIN XL) 300 MG 24 hr tablet Take 300 mg by mouth daily. 08/25/14   [provider]  busPIRone (BUSPAR) 10 MG tablet Take 10 mg by mouth 2 (two) times daily.  07/16/15   [provider]  ergocalciferol (VITAMIN D2) 50000 units capsule Take 50,000 Units by mouth once a week. Reported on 10/08/2015    [provider]  escitalopram (LEXAPRO) 10  MG tablet Take 20 mg by mouth daily.     [provider]  glimepiride (AMARYL) 4 MG tablet Take 4 mg by mouth at bedtime.     [provider]  HYDROcodone-acetaminophen (NORCO/VICODIN) 5-325 MG tablet Take 1 tablet by mouth every 6 (six) hours as needed for moderate pain. Reported on 07/23/2015    [provider]  metFORMIN (GLUCOPHAGE) 1000 MG tablet Take 1,000 mg by mouth 2 (two) times daily with a meal.    [provider]  methocarbamol (ROBAXIN) 500 MG tablet Take 1 tablet (500 mg total) by mouth 2 (two) times daily. 06/24/16   Orpah Greek, MD  oxybutynin (DITROPAN) 5 MG tablet Take 5 mg by mouth 2 (two) times daily. 08/04/14   [provider]  predniSONE (DELTASONE) 10 MG tablet Take 4 tablets (40 mg total) by mouth daily with breakfast for 5 days. 05/17/17 05/22/17  Rodell Perna A, PA-C  tadalafil (CIALIS) 5 MG tablet Take 5 mg by mouth daily as needed for erectile dysfunction.    [provider]    Family History Family History  Problem Relation Age of Onset  . Heart disease Mother 52  . Diabetes Mother   . Diabetes Brother   . Stomach cancer Brother   . Colon polyps Maternal Aunt   . Prostate cancer Paternal Grandmother   . Colon cancer Neg Hx   . Rectal cancer Neg Hx     Social History Social History   Tobacco Use  . Smoking status: Former Smoker    Packs/day: 1.00    Years: 11.00    Pack years: 11.00    Types: Cigarettes    Last attempt to quit: 05/24/1983    Years since quitting: 34.0  . Smokeless tobacco: Former Systems developer    Quit date: 05/24/1988  Substance Use Topics  . Alcohol use: No    Alcohol/week: 0.0 oz    Comment: previously alcohol use, none since 2005  . Drug use: No    Comment: previously used cocaine, none since 2005     Allergies   Shellfish allergy and Latex   Review of Systems Review of Systems  Constitutional: Negative for chills and fever.  Respiratory: Negative for shortness of breath.    Cardiovascular: Negative for chest pain.  Musculoskeletal: Positive for back pain and myalgias. Negative for arthralgias.  Neurological: Negative for syncope, weakness and numbness.     Physical Exam Updated Vital Signs BP (!) 132/100 (BP Location: Right Arm)   Pulse 82   Temp 98.2 F (36.8 C) (Oral)   Resp 18   SpO2 100%   Physical Exam  Constitutional: He is oriented to person, place, and time. He appears well-developed and well-nourished. No distress.  HENT:  Head: Normocephalic  and atraumatic.  Eyes: Conjunctivae and EOM are normal. Pupils are equal, round, and reactive to light. Right eye exhibits no discharge. Left eye exhibits no discharge.  Neck: Normal range of motion. Neck supple. No JVD present. No tracheal deviation present.  No midline spine TTP, no paraspinal muscle tenderness, no deformity, crepitus, or step-off noted   Cardiovascular: Normal rate.  Pulmonary/Chest: Effort normal and breath sounds normal. He exhibits no tenderness.  Equal rise and fall of chest, no increased work of breathing, no anterior or lateral chest wall tenderness to palpation.  Abdominal: He exhibits no distension.  Musculoskeletal: Normal range of motion. He exhibits tenderness. He exhibits no edema.  No midline spine TTP, right parathoracic muscle tenderness and spasm at around the levels of T6-T9, no deformity, crepitus, or step-off noted.  Pain worsens with lateral rotation of the trunk.  Also worsens with upward motion of the right shoulder.  Examination of the shoulders bilaterally is unremarkable.  5/5 strength of BUE major muscle groups   Neurological: He is alert and oriented to person, place, and time. No cranial nerve deficit or sensory deficit. He exhibits normal muscle tone.  Fluent speech, no facial droop, sensation intact to soft touch of extremities, normal gait, and patient able to heel walk and toe walk without difficulty.  Good grip strength bilaterally   Skin: Skin is warm  and dry. No erythema.  Psychiatric: He has a normal mood and affect. His behavior is normal.  Nursing note and vitals reviewed.    ED Treatments / Results  Labs (all labs ordered are listed, but only abnormal results are displayed) Labs Reviewed - No data to display  EKG  EKG Interpretation None       Radiology No results found.  Procedures Procedures (including critical care time)  Medications Ordered in ED Medications  predniSONE (DELTASONE) tablet 60 mg (60 mg Oral Given 05/17/17 1756)     Initial Impression / Assessment and Plan / ED Course  I have reviewed the triage vital signs and the nursing notes.  Pertinent labs & imaging results that were available during my care of the patient were reviewed by me and considered in my medical decision making (see chart for details).     Patient with acute on chronic thoracic back pain.  Pain is likely musculoskeletal in nature and worsens with palpation and movement.  Afebrile, vital signs are stable.  He is nontoxic in appearance.  He denies chest pain or shortness of breath and I doubt cardiac or pulmonary etiology to his symptoms.  I doubt aortic dissection.  Doubt fracture or dislocation in the absence of trauma.  He is ambulatory without difficulty.  No red flag signs concerning for cauda equina or spinal abscess.  He has tolerated steroids in the past without elevation of his blood sugars.  Will discharge with steroid pack and discussed the use of NSAIDs, ice, and heat.  He will follow-up with his orthopedist for reevaluation.  Discussed indications for return to the ED.Pt verbalized understanding of and agreement with plan and is safe for discharge home at this time.   Final Clinical Impressions(s) / ED Diagnoses   Final diagnoses:  Acute right-sided thoracic back pain    ED Discharge Orders        Ordered    predniSONE (DELTASONE) 10 MG tablet  Daily with breakfast     05/17/17 1758       Renita Papa,  PA-C 05/17/17 1802    Long, [place]  G, MD 05/17/17 2354

## 2017-05-17 NOTE — ED Triage Notes (Signed)
Pt to ER for evaluation of right lateral upper back pain x2 days, states hx of same. NAD. Denies injury. Worse with movement. States hx of same.

## 2017-05-17 NOTE — ED Notes (Signed)
Patient verbalized understanding of discharge instructions and denies any further needs or questions at this time. VS stable. Patient ambulatory with steady gait.  

## 2017-05-17 NOTE — Discharge Instructions (Signed)
Start taking prednisone beginning tomorrow.  You received the first dose in the emergency department today.  Check your blood sugar while taking this medicine.  If your blood sugars begin to increase, stop taking this medicine and contact your primary care physician for further recommendations.  Do not take ibuprofen, Advil, Motrin, or Aleve while on this medicine.  You may take [phone] mg of Tylenol every 6 hours as needed for pain additionally.  Apply ice or heat for comfort.  Do some gentle stretching during hot showers and baths to avoid muscle stiffness. Follow up with your orthopedic physician for re-evaluation within 1 week.  Return to the ED if any concerning signs or symptoms develop such as fever, chest pain, difficulty breathing, numbness, or weakness.

## 2018-02-12 ENCOUNTER — Ambulatory Visit (INDEPENDENT_AMBULATORY_CARE_PROVIDER_SITE_OTHER): Payer: Medicare Other | Admitting: Internal Medicine

## 2018-02-12 ENCOUNTER — Encounter: Payer: Self-pay | Admitting: Internal Medicine

## 2018-02-12 VITALS — BP 130/86 | HR 64 | Ht 66.0 in | Wt 278.4 lb

## 2018-02-12 DIAGNOSIS — I2510 Atherosclerotic heart disease of native coronary artery without angina pectoris: Secondary | ICD-10-CM | POA: Diagnosis not present

## 2018-02-12 DIAGNOSIS — G4730 Sleep apnea, unspecified: Secondary | ICD-10-CM | POA: Diagnosis not present

## 2018-02-12 DIAGNOSIS — I10 Essential (primary) hypertension: Secondary | ICD-10-CM | POA: Diagnosis not present

## 2018-02-12 DIAGNOSIS — E782 Mixed hyperlipidemia: Secondary | ICD-10-CM

## 2018-02-12 NOTE — Patient Instructions (Addendum)
Your physician recommends that you continue on your current medications as directed. Please refer to the Current Medication list given to you today. Your physician wants you to follow-up in: January 2021 with Dr. Harrington Challenger.  You will receive a reminder letter in the mail two months in advance. If you don't receive a letter, please call our office to schedule the follow-up appointment.  Addendum: left message for medical records at PCP office to send last ov note and labs including lipids from Dr. Jeanie Cooks. [email or web address],RN/02/14/18

## 2018-02-12 NOTE — Progress Notes (Signed)
HPI Alexander Mack is a 57 y.o. with a history of nonobstructive CAD by cardiac catheterization in 2007 and negative Myoview in 2012  He also has a hx of, DM2, HL, sleep apnea, obesity.  I last saw the ptJune 2015  He walks on treadmill  To exercise wearing a 100 lb vest  Walks at 3 miles per hour for 30 min    He denies CP   Breathing is OK   Allergies  Allergen Reactions  . Shellfish Allergy Shortness Of Breath, Nausea Only and Palpitations  . Other   . Latex Rash    Current Outpatient Medications  Medication Sig Dispense Refill  . aspirin 81 MG tablet Take 81 mg by mouth daily. Reported on 07/23/2015    . atorvastatin (LIPITOR) 20 MG tablet Take 20 mg by mouth daily.      Marland Kitchen buPROPion (WELLBUTRIN XL) 300 MG 24 hr tablet Take 300 mg by mouth daily.  2  . busPIRone (BUSPAR) 10 MG tablet Take 10 mg by mouth 2 (two) times daily.     . ergocalciferol (VITAMIN D2) 50000 units capsule Take 50,000 Units by mouth once a week. Reported on 10/08/2015    . escitalopram (LEXAPRO) 10 MG tablet Take 20 mg by mouth daily.     Marland Kitchen glimepiride (AMARYL) 4 MG tablet Take 4 mg by mouth at bedtime.     Marland Kitchen HYDROcodone-acetaminophen (NORCO/VICODIN) 5-325 MG tablet Take 1 tablet by mouth every 6 (six) hours as needed for moderate pain. Reported on 07/23/2015    . metFORMIN (GLUCOPHAGE) 1000 MG tablet Take 1,000 mg by mouth 2 (two) times daily with a meal.    . methocarbamol (ROBAXIN) 500 MG tablet Take 1 tablet (500 mg total) by mouth 2 (two) times daily. 20 tablet 0  . methylphenidate (RITALIN) 10 MG tablet Take 10 mg by mouth daily.  0  . oxybutynin (DITROPAN) 5 MG tablet Take 5 mg by mouth 2 (two) times daily.  5   No current facility-administered medications for this visit.     Past Medical History:  Diagnosis Date  . Alcohol abuse   . Anxiety   . Anxiety and depression   . Arthritis   . CAD (coronary artery disease)    a. 11.2007 Cath: LM 84mm, nl, LAD 30-109m, D2 30-40, LCX 20-30p, RCA nl, EF 50%;  b.  10.2012 Myoview: no isch/scar.  . Cataract   . Chronic pain    due to slipped disk, arthritis  . Depression   . Diabetes mellitus   . 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 Drug dependence   . Dyslipidemia   . Glaucoma   . History of arthroscopy   . History of cardiac cath   . Nonspecific abnormal results of liver function study   . Obese   . Personal history of noncompliance with medical treatment, presenting hazards to health   . Plantar fascial fibromatosis   . Sexually transmitted disease   . Sleep apnea    wears cpap  . Unspecified essential hypertension     Past Surgical History:  Procedure Laterality Date  . APPENDECTOMY    . CARDIAC CATHETERIZATION  2007  . COLONOSCOPY    . POLYPECTOMY      Family History  Problem Relation Age of Onset  . Heart disease Mother 50  . Diabetes Mother   . Diabetes Brother   . Stomach cancer Brother   . Colon polyps Maternal Aunt   . Prostate  cancer Paternal Grandmother   . Colon cancer Neg Hx   . Rectal cancer Neg Hx     Social History   Socioeconomic History  . Marital status: Single    Spouse name: Not on file  . Number of children: Not on file  . Years of education: Not on file  . Highest education level: Not on file  Occupational History  . Not on file  Social Needs  . Financial resource strain: Not on file  . Food insecurity:    Worry: Not on file    Inability: Not on file  . Transportation needs:    Medical: Not on file    Non-medical: Not on file  Tobacco Use  . Smoking status: Former Smoker    Packs/day: 1.00    Years: 11.00    Pack years: 11.00    Types: Cigarettes    Last attempt to quit: 05/24/1983    Years since quitting: 34.7  . Smokeless tobacco: Former Systems developer    Quit date: 05/24/1988  Substance and Sexual Activity  . Alcohol use: No    Alcohol/week: 0.0 standard drinks    Comment: previously alcohol use, none since 2005  . Drug use: No    Comment: previously used  cocaine, none since 2005  . Sexual activity: Yes  Lifestyle  . Physical activity:    Days per week: Not on file    Minutes per session: Not on file  . Stress: Not on file  Relationships  . Social connections:    Talks on phone: Not on file    Gets together: Not on file    Attends religious service: Not on file    Active member of club or organization: Not on file    Attends meetings of clubs or organizations: Not on file    Relationship status: Not on file  . Intimate partner violence:    Fear of current or ex partner: Not on file    Emotionally abused: Not on file    Physically abused: Not on file    Forced sexual activity: Not on file  Other Topics Concern  . Not on file  Social History Narrative   Single. Lives with child. Disability.      Review of Systems:  All systems reviewed.  They are negative to the above problem except as previously stated.  Vital Signs: BP 130/86   Pulse 64   Ht 5\' 6"  (1.676 m)   Wt 278 lb 6.4 oz (126.3 kg)   BMI 44.93 kg/m  On my check 132/92 Physical Exam Morbidly obese 57 yo in NAD   HEENT:  Normocephalic, atraumatic. EOMI, PERRLA.  Neck:Neck is full  No bruits.  Lungs: clear to auscultation. No rales no wheezes.  Heart: Regular rate and rhythm. Normal S1, S2. No S3.   No significant murmurs. PMI not displaced.  Abdomen:  Obese nontender. Normal bowel sounds.    Extremities:   Good distal pulses throughout. Triv lower extremity edema.  Musculoskeletal :moving all extremities.  Neuro:   alert and oriented x3.  CN II-XII grossly intact.  EKG   SR 64 bpm    Assessment and Plan:  1  CAD   Pt without symptoms of angina   Encouraged him to remain active     3.  HTN   BP is adequately controlled  3.  Hx GERD   Pt denies symptoms    4.  HL  Need to get lipiids from Dr Jackson Latino  5.  Sleep apnea  Encouraged him to continue CPAP  6  Morbid obesity  Discussed diet   Continue exercise

## 2018-06-03 ENCOUNTER — Encounter (HOSPITAL_COMMUNITY): Payer: Self-pay | Admitting: Emergency Medicine

## 2018-06-03 ENCOUNTER — Emergency Department (HOSPITAL_COMMUNITY)
Admission: EM | Admit: 2018-06-03 | Discharge: 2018-06-03 | Disposition: A | Discharge status: Home / Self Care | Payer: Medicare Other | Attending: Emergency Medicine | Admitting: Emergency Medicine

## 2018-06-03 ENCOUNTER — Other Ambulatory Visit: Payer: Self-pay

## 2018-06-03 DIAGNOSIS — Y939 Activity, unspecified: Secondary | ICD-10-CM | POA: Diagnosis not present

## 2018-06-03 DIAGNOSIS — I10 Essential (primary) hypertension: Secondary | ICD-10-CM | POA: Insufficient documentation

## 2018-06-03 DIAGNOSIS — Z7984 Long term (current) use of oral hypoglycemic drugs: Secondary | ICD-10-CM | POA: Insufficient documentation

## 2018-06-03 DIAGNOSIS — Y998 Other external cause status: Secondary | ICD-10-CM | POA: Diagnosis not present

## 2018-06-03 DIAGNOSIS — Y33XXXA Other specified events, undetermined intent, initial encounter: Secondary | ICD-10-CM | POA: Insufficient documentation

## 2018-06-03 DIAGNOSIS — Z7982 Long term (current) use of aspirin: Secondary | ICD-10-CM | POA: Diagnosis not present

## 2018-06-03 DIAGNOSIS — E785 Hyperlipidemia, unspecified: Secondary | ICD-10-CM | POA: Diagnosis not present

## 2018-06-03 DIAGNOSIS — S39012A Strain of muscle, fascia and tendon of lower back, initial encounter: Secondary | ICD-10-CM | POA: Diagnosis not present

## 2018-06-03 DIAGNOSIS — I2510 Atherosclerotic heart disease of native coronary artery without angina pectoris: Secondary | ICD-10-CM | POA: Insufficient documentation

## 2018-06-03 DIAGNOSIS — Z87891 Personal history of nicotine dependence: Secondary | ICD-10-CM | POA: Insufficient documentation

## 2018-06-03 DIAGNOSIS — Y929 Unspecified place or not applicable: Secondary | ICD-10-CM | POA: Diagnosis not present

## 2018-06-03 DIAGNOSIS — Z79899 Other long term (current) drug therapy: Secondary | ICD-10-CM | POA: Insufficient documentation

## 2018-06-03 DIAGNOSIS — E119 Type 2 diabetes mellitus without complications: Secondary | ICD-10-CM | POA: Insufficient documentation

## 2018-06-03 DIAGNOSIS — S3992XA Unspecified injury of lower back, initial encounter: Secondary | ICD-10-CM | POA: Diagnosis present

## 2018-06-03 MED ORDER — METHOCARBAMOL 500 MG PO TABS: 500.0000 mg | ORAL_TABLET | Freq: Three times a day (TID) | ORAL | 0 refills | Status: DC | PRN

## 2018-06-03 NOTE — ED Provider Notes (Signed)
TIME SEEN: 2:56 AM  CHIEF COMPLAINT: Back pain  HPI: Patient is a 58 year old male with history of CAD, diabetes, dyslipidemia, documented history of substance abuse who presents to the emergency department with complaints of back pain.  States he thinks he twisted his back a week ago and it is causing him to have right lower back pain that radiates down his posterior right leg.  No numbness, tingling or focal weakness.  No bowel or bladder incontinence.  No fever.  States he drove himself to the emergency department.  Taking ibuprofen without relief.  ROS: See HPI Constitutional: no fever  Eyes: no drainage  ENT: no runny nose   Cardiovascular:  no chest pain  Resp: no SOB  GI: no vomiting GU: no dysuria Integumentary: no rash  Allergy: no hives  Musculoskeletal: no leg swelling  Neurological: no slurred speech ROS otherwise negative  PAST MEDICAL HISTORY/PAST SURGICAL HISTORY:  Past Medical History:  Diagnosis Date  . Alcohol abuse   . Anxiety   . Anxiety and depression   . Arthritis   . CAD (coronary artery disease)    a. 11.2007 Cath: LM 74mm, nl, LAD 30-37m, D2 30-40, LCX 20-30p, RCA nl, EF 50%;  b. 10.2012 Myoview: no isch/scar.  . Cataract   . Chronic pain    due to slipped disk, arthritis  . Depression   . Diabetes mellitus   . 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 Drug dependence   . Dyslipidemia   . Glaucoma   . History of arthroscopy   . History of cardiac cath   . Nonspecific abnormal results of liver function study   . Obese   . Personal history of noncompliance with medical treatment, presenting hazards to health   . Plantar fascial fibromatosis   . Sexually transmitted disease   . Sleep apnea    wears cpap  . Unspecified essential hypertension     MEDICATIONS:  Prior to Admission medications   Medication Sig Start Date End Date Taking? Authorizing Provider  aspirin 81 MG tablet Take 81 mg by mouth daily. Reported on  07/23/2015    [provider]  atorvastatin (LIPITOR) 20 MG tablet Take 20 mg by mouth daily.      [provider]  buPROPion (WELLBUTRIN XL) 300 MG 24 hr tablet Take 300 mg by mouth daily. 08/25/14   [provider]  busPIRone (BUSPAR) 10 MG tablet Take 10 mg by mouth 2 (two) times daily.  07/16/15   [provider]  ergocalciferol (VITAMIN D2) 50000 units capsule Take 50,000 Units by mouth once a week. Reported on 10/08/2015    [provider]  escitalopram (LEXAPRO) 10 MG tablet Take 20 mg by mouth daily.     [provider]  glimepiride (AMARYL) 4 MG tablet Take 4 mg by mouth at bedtime.     [provider]  HYDROcodone-acetaminophen (NORCO/VICODIN) 5-325 MG tablet Take 1 tablet by mouth every 6 (six) hours as needed for moderate pain. Reported on 07/23/2015    [provider]  metFORMIN (GLUCOPHAGE) 1000 MG tablet Take 1,000 mg by mouth 2 (two) times daily with a meal.    [provider]  methocarbamol (ROBAXIN) 500 MG tablet Take 1 tablet (500 mg total) by mouth 2 (two) times daily. 06/24/16   Orpah Greek, MD  methylphenidate (RITALIN) 10 MG tablet Take 10 mg by mouth daily. 02/02/18   [provider]  oxybutynin (DITROPAN) 5 MG tablet Take  5 mg by mouth 2 (two) times daily. 08/04/14   [provider]    ALLERGIES:  Allergies  Allergen Reactions  . Shellfish Allergy Shortness Of Breath, Nausea Only and Palpitations  . Other   . Latex Rash    SOCIAL HISTORY:  Social History   Tobacco Use  . Smoking status: Former Smoker    Packs/day: 1.00    Years: 11.00    Pack years: 11.00    Types: Cigarettes    Last attempt to quit: 05/24/1983    Years since quitting: 35.0  . Smokeless tobacco: Former Systems developer    Quit date: 05/24/1988  Substance Use Topics  . Alcohol use: No    Alcohol/week: 0.0 standard drinks    Comment: previously alcohol use, none since 2005    FAMILY HISTORY: Family  History  Problem Relation Age of Onset  . Heart disease Mother 13  . Diabetes Mother   . Diabetes Brother   . Stomach cancer Brother   . Colon polyps Maternal Aunt   . Prostate cancer Paternal Grandmother   . Colon cancer Neg Hx   . Rectal cancer Neg Hx     EXAM: BP 126/79 (BP Location: Right Arm)   Pulse 78   Temp 97.9 F (36.6 C) (Oral)   Resp 16   SpO2 98%  CONSTITUTIONAL: Alert and oriented and responds appropriately to questions. Well-appearing; well-nourished, obese, no distress HEAD: Normocephalic EYES: Conjunctivae clear, pupils appear equal, EOMI ENT: normal nose; moist mucous membranes NECK: Supple, no meningismus, no nuchal rigidity, no LAD  CARD: RRR; S1 and S2 appreciated; no murmurs, no clicks, no rubs, no gallops RESP: Normal chest excursion without splinting or tachypnea; breath sounds clear and equal bilaterally; no wheezes, no rhonchi, no rales, no hypoxia or respiratory distress, speaking full sentences ABD/GI: Normal bowel sounds; non-distended; soft, non-tender, no rebound, no guarding, no peritoneal signs, no hepatosplenomegaly BACK:  The back appears normal and is tender over the right lumbar paraspinal muscles without erythema, warmth, ecchymosis or swelling.  There is no midline spinal tenderness or step-off or deformity. there is no CVA tenderness EXT: Normal ROM in all joints; non-tender to palpation; no edema; normal capillary refill; no cyanosis, no calf tenderness or swelling    SKIN: Normal color for age and race; warm; no rash NEURO: Moves all extremities equally, strength 5/5 in all 4 extremities, sensation to light touch intact diffusely, normal deep tendon reflexes in bilateral lower extremities, no clonus, no saddle anesthesia PSYCH: The patient's mood and manner are appropriate. Grooming and personal hygiene are appropriate.  MEDICAL DECISION MAKING: Patient here with back pain.  Seems to be mostly muscular in nature.  Has pain with sitting  upright but is able to do so without difficulty.  No focal neurologic deficits today.  Doubt cauda equina, epidural abscess or hematoma, discitis or osteomyelitis, transverse myelitis.  No injury to suggest fracture.  He is ambulatory.  Drove himself to the emergency department.  Recommended continuing ibuprofen.  Having some mild radicular symptoms but given he is a diabetic I feel risk of steroids outweigh benefit.  He agrees.  Will discharge with prescription of Robaxin.  Will avoid narcotics at this time given he has a history of chronic back pain and a documented history of substance abuse.  At this time, I do not feel there is any life-threatening condition present. I have reviewed and discussed all results (EKG, imaging, lab, urine as appropriate) and exam findings with patient/family. I have reviewed  nursing notes and appropriate previous records.  I feel the patient is safe to be discharged home without further emergent workup and can continue workup as an outpatient as needed. Discussed usual and customary return precautions. Patient/family verbalize understanding and are comfortable with this plan.  Outpatient follow-up has been provided as needed. All questions have been answered.      , Delice Bison, DO 06/03/18 [id number]

## 2018-06-03 NOTE — ED Notes (Signed)
Patient verbalizes understanding of discharge instructions. Opportunity for questioning and answers were provided. Armband removed by staff, pt discharged from ED ambulatory.   

## 2018-06-03 NOTE — ED Triage Notes (Signed)
Pt c/o lower back pain x 1 week. Denies fall, states he thinks he lifted something "wrong". Ambulatory without difficulty.

## 2018-06-03 NOTE — Discharge Instructions (Signed)
You may alternate Tylenol 1000 mg every 6 hours as needed for pain and Ibuprofen 800 mg every 8 hours as needed for pain.  Please take Ibuprofen with food. ° °

## 2018-07-17 ENCOUNTER — Encounter (INDEPENDENT_AMBULATORY_CARE_PROVIDER_SITE_OTHER): Payer: Self-pay

## 2018-07-25 ENCOUNTER — Ambulatory Visit (INDEPENDENT_AMBULATORY_CARE_PROVIDER_SITE_OTHER): Payer: Self-pay | Admitting: Bariatrics

## 2018-07-29 ENCOUNTER — Emergency Department (HOSPITAL_COMMUNITY): Payer: Medicare Other

## 2018-07-29 ENCOUNTER — Encounter (HOSPITAL_COMMUNITY): Payer: Self-pay | Admitting: Emergency Medicine

## 2018-07-29 ENCOUNTER — Emergency Department (HOSPITAL_COMMUNITY)
Admission: EM | Admit: 2018-07-29 | Discharge: 2018-07-29 | Disposition: A | Discharge status: Home / Self Care | Payer: Medicare Other | Attending: Emergency Medicine | Admitting: Emergency Medicine

## 2018-07-29 DIAGNOSIS — E119 Type 2 diabetes mellitus without complications: Secondary | ICD-10-CM | POA: Insufficient documentation

## 2018-07-29 DIAGNOSIS — R1310 Dysphagia, unspecified: Secondary | ICD-10-CM | POA: Diagnosis present

## 2018-07-29 DIAGNOSIS — I10 Essential (primary) hypertension: Secondary | ICD-10-CM | POA: Diagnosis not present

## 2018-07-29 DIAGNOSIS — I2510 Atherosclerotic heart disease of native coronary artery without angina pectoris: Secondary | ICD-10-CM | POA: Diagnosis not present

## 2018-07-29 DIAGNOSIS — Z79899 Other long term (current) drug therapy: Secondary | ICD-10-CM | POA: Diagnosis not present

## 2018-07-29 DIAGNOSIS — L739 Follicular disorder, unspecified: Secondary | ICD-10-CM | POA: Insufficient documentation

## 2018-07-29 DIAGNOSIS — J40 Bronchitis, not specified as acute or chronic: Secondary | ICD-10-CM

## 2018-07-29 DIAGNOSIS — Z7982 Long term (current) use of aspirin: Secondary | ICD-10-CM | POA: Insufficient documentation

## 2018-07-29 DIAGNOSIS — Z91040 Latex allergy status: Secondary | ICD-10-CM | POA: Insufficient documentation

## 2018-07-29 DIAGNOSIS — E782 Mixed hyperlipidemia: Secondary | ICD-10-CM | POA: Insufficient documentation

## 2018-07-29 DIAGNOSIS — Z7984 Long term (current) use of oral hypoglycemic drugs: Secondary | ICD-10-CM | POA: Insufficient documentation

## 2018-07-29 DIAGNOSIS — Z87891 Personal history of nicotine dependence: Secondary | ICD-10-CM | POA: Insufficient documentation

## 2018-07-29 LAB — CBC WITH DIFFERENTIAL/PLATELET
Abs Immature Granulocytes: 0.01 10*3/uL (ref 0.00–0.07)
BASOS ABS: 0 10*3/uL (ref 0.0–0.1)
BASOS PCT: 1 %
EOS ABS: 0.2 10*3/uL (ref 0.0–0.5)
Eosinophils Relative: 3 %
HCT: 39.7 % (ref 39.0–52.0)
Hemoglobin: 12.4 g/dL — ABNORMAL LOW (ref 13.0–17.0)
IMMATURE GRANULOCYTES: 0 %
Lymphocytes Relative: 33 %
Lymphs Abs: 2.1 10*3/uL (ref 0.7–4.0)
MCH: 26.4 pg (ref 26.0–34.0)
MCHC: 31.2 g/dL (ref 30.0–36.0)
MCV: 84.6 fL (ref 80.0–100.0)
Monocytes Absolute: 0.7 10*3/uL (ref 0.1–1.0)
Monocytes Relative: 11 %
NEUTROS PCT: 52 %
NRBC: 0 % (ref 0.0–0.2)
Neutro Abs: 3.5 10*3/uL (ref 1.7–7.7)
PLATELETS: 212 10*3/uL (ref 150–400)
RBC: 4.69 MIL/uL (ref 4.22–5.81)
RDW: 15.1 % (ref 11.5–15.5)
WBC: 6.5 10*3/uL (ref 4.0–10.5)

## 2018-07-29 LAB — BASIC METABOLIC PANEL
ANION GAP: 10 (ref 5–15)
BUN: 8 mg/dL (ref 6–20)
CALCIUM: 8.9 mg/dL (ref 8.9–10.3)
CO2: 24 mmol/L (ref 22–32)
CREATININE: 1.2 mg/dL (ref 0.61–1.24)
Chloride: 106 mmol/L (ref 98–111)
GLUCOSE: 118 mg/dL — AB (ref 70–99)
Potassium: 3.7 mmol/L (ref 3.5–5.1)
Sodium: 140 mmol/L (ref 135–145)

## 2018-07-29 MED ORDER — DOXYCYCLINE HYCLATE 100 MG PO CAPS: 100.0000 mg | ORAL_CAPSULE | Freq: Two times a day (BID) | ORAL | 0 refills | Status: AC

## 2018-07-29 MED ORDER — DOXYCYCLINE HYCLATE 100 MG PO TABS
100.0000 mg | ORAL_TABLET | Freq: Once | ORAL | Status: AC
  Administered 2018-07-29: 100 mg via ORAL
  Filled 2018-07-29: qty 1

## 2018-07-29 NOTE — Discharge Instructions (Signed)
Call your landlord first in the morning to discuss your concerns  For the area of drainage in your neck, this is a folliculitis which is inflammation around a hair follicle.  Do your best to minimize friction in the area.  Do not rub or scratch this area.  I have prescribed an antibiotic.  To help with drainage, I would recommend applying a warm cloth gently 2-3 times daily to help prevent it from becoming an abscess.  If symptoms continue to recur, I would recommend seeing a dermatologist as that you may need a cyst removed.  Discuss with your primary doctor.

## 2018-07-29 NOTE — ED Notes (Signed)
Patient verbalizes understanding of discharge instructions. Opportunity for questioning and answers were provided. Armband removed by staff, pt discharged from ED.  

## 2018-07-29 NOTE — ED Notes (Signed)
Patient ambulated from waiting room to room, tolerated well

## 2018-07-29 NOTE — ED Provider Notes (Signed)
[facility] EMERGENCY DEPARTMENT Provider Note   CSN: [id number] Arrival date & time: 07/29/18  3557    History   Chief Complaint Chief Complaint  Patient presents with  . Dysphagia    HPI Alexander Mack is a 58 y.o. male.     HPI   58 yo M with h/o anxiety, CAD, DM, here with multiple complaints. Pt reports that he recently learned his septic tank has been spilling for 2-3 weeks. He feels like he needs to be checked out for "infeciton."  He reports that he has a chronic, recurrent "boil" on the back of his neck which he feels like is starting to "flare up," though he denies any drainage. He is concerned this could be infected from exposure. Denies any fever, chills. He also has had increased throat and chest congestion with a harsh, productive cough. No SOB, no chest pain. He otherwise denies any new complaints. He has chronic arthralgias that are mildly worse x 2-3 weeks, but denies any specific joints or areas of pain. No areas of redness/warmth. No known gas or other exposures in the home. He has some minimal, palpable pain of his neck which is chronic.  Past Medical History:  Diagnosis Date  . Alcohol abuse   . Anxiety   . Anxiety and depression   . Arthritis   . CAD (coronary artery disease)    a. 11.2007 Cath: LM 91mm, nl, LAD 30-68m, D2 30-40, LCX 20-30p, RCA nl, EF 50%;  b. 10.2012 Myoview: no isch/scar.  . Cataract   . Chronic pain    due to slipped disk, arthritis  . Depression   . Diabetes mellitus   . 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 Drug dependence   . Dyslipidemia   . Glaucoma   . History of arthroscopy   . History of cardiac cath   . Nonspecific abnormal results of liver function study   . Obese   . Personal history of noncompliance with medical treatment, presenting hazards to health   . Plantar fascial fibromatosis   . Sexually transmitted disease   . Sleep apnea    wears cpap  . Unspecified  essential hypertension     Patient Active Problem List   Diagnosis Date Noted  . Angina at rest [facility]) 01/17/2013  . Obstructive sleep apnea 12/24/2008  . HYPERLIPIDEMIA-MIXED 11/05/2008  . Obesity, unspecified 11/05/2008  . CAD, NATIVE VESSEL 11/05/2008  . SEXUALLY TRANSMITTED DISEASE 11/01/2006  . DM 11/01/2006  . DRUG DEPENDENCE 11/01/2006  . ABUSE, ALCOHOL, UNSPECIFIED 11/01/2006  . Essential hypertension 11/01/2006  . DISPLACEMENT, LUMBAR DISC W/O MYELOPATHY 11/01/2006  . PLANTAR FASCIITIS 11/01/2006  . ABNORMAL RESULT, FUNCTION STUDY, LIVER 11/01/2006  . HX, PERSONAL, PAST NONCOMPLIANCE 11/01/2006  . ARTHROSCOPY, HX OF 11/01/2006    Past Surgical History:  Procedure Laterality Date  . APPENDECTOMY    . CARDIAC CATHETERIZATION  2007  . COLONOSCOPY    . POLYPECTOMY          Home Medications    Prior to Admission medications   Medication Sig Start Date End Date Taking? Authorizing Provider  aspirin 81 MG tablet Take 81 mg by mouth daily. Reported on 07/23/2015    [provider]  atorvastatin (LIPITOR) 20 MG tablet Take 20 mg by mouth daily.      [provider]  buPROPion (WELLBUTRIN XL) 300 MG 24 hr tablet Take 300 mg by mouth daily. 08/25/14   [provider]  busPIRone (BUSPAR) 10 MG tablet Take 10 mg by mouth 2 (two) times daily.  07/16/15   [provider]  doxycycline (VIBRAMYCIN) 100 MG capsule Take 1 capsule (100 mg total) by mouth 2 (two) times daily for 7 days. 07/29/18 08/05/18  Duffy Bruce, MD  ergocalciferol (VITAMIN D2) 50000 units capsule Take 50,000 Units by mouth once a week. Reported on 10/08/2015    [provider]  escitalopram (LEXAPRO) 10 MG tablet Take 20 mg by mouth daily.     [provider]  glimepiride (AMARYL) 4 MG tablet Take 4 mg by mouth at bedtime.     [provider]  HYDROcodone-acetaminophen (NORCO/VICODIN) 5-325 MG tablet Take 1 tablet by mouth every 6 (six) hours as needed  for moderate pain. Reported on 07/23/2015    [provider]  metFORMIN (GLUCOPHAGE) 1000 MG tablet Take 1,000 mg by mouth 2 (two) times daily with a meal.    [provider]  methocarbamol (ROBAXIN) 500 MG tablet Take 1 tablet (500 mg total) by mouth every 8 (eight) hours as needed for muscle spasms. 06/03/18   Ward, Delice Bison, DO  methylphenidate (RITALIN) 10 MG tablet Take 10 mg by mouth daily. 02/02/18   [provider]  oxybutynin (DITROPAN) 5 MG tablet Take 5 mg by mouth 2 (two) times daily. 08/04/14   [provider]    Family History Family History  Problem Relation Age of Onset  . Heart disease Mother 73  . Diabetes Mother   . Diabetes Brother   . Stomach cancer Brother   . Colon polyps Maternal Aunt   . Prostate cancer Paternal Grandmother   . Colon cancer Neg Hx   . Rectal cancer Neg Hx     Social History Social History   Tobacco Use  . Smoking status: Former Smoker    Packs/day: 1.00    Years: 11.00    Pack years: 11.00    Types: Cigarettes    Last attempt to quit: 05/24/1983    Years since quitting: 35.2  . Smokeless tobacco: Former Systems developer    Quit date: 05/24/1988  Substance Use Topics  . Alcohol use: No    Alcohol/week: 0.0 standard drinks    Comment: previously alcohol use, none since 2005  . Drug use: No    Comment: previously used cocaine, none since 2005     Allergies   Shellfish allergy; Other; and Latex   Review of Systems Review of Systems  Constitutional: Positive for fatigue. Negative for chills and fever.  HENT: Negative for congestion and rhinorrhea.   Eyes: Negative for visual disturbance.  Respiratory: Positive for cough. Negative for shortness of breath and wheezing.   Cardiovascular: Negative for chest pain and leg swelling.  Gastrointestinal: Negative for abdominal pain, diarrhea, nausea and vomiting.  Genitourinary: Negative for dysuria and flank pain.  Musculoskeletal: Positive for arthralgias. Negative  for neck pain and neck stiffness.  Skin: Positive for rash and wound.  Allergic/Immunologic: Negative for immunocompromised state.  Neurological: Positive for weakness. Negative for syncope and headaches.  All other systems reviewed and are negative.    Physical Exam Updated Vital Signs BP 120/70   Pulse 70   Temp 97.7 F (36.5 C) (Oral)   Resp 14   Ht 5\' 6"  (1.676 m)   Wt 127 kg   SpO2 100%   BMI 45.19 kg/m   Physical Exam Vitals signs and nursing note reviewed.  Constitutional:      General: He is not in  acute distress.    Appearance: He is well-developed.  HENT:     Head: Normocephalic and atraumatic.     Comments: Multiple scars along inferior border of hair line, with mild skin thickening and alopecia. Central, small, rounded area of induration and TTP without fluctuance, with central area of prior drainage. No surrounding induration or TTP. Eyes:     Conjunctiva/sclera: Conjunctivae normal.  Neck:     Musculoskeletal: Neck supple.  Cardiovascular:     Rate and Rhythm: Normal rate and regular rhythm.     Heart sounds: Normal heart sounds. No murmur. No friction rub.  Pulmonary:     Effort: Pulmonary effort is normal. No respiratory distress.     Breath sounds: Examination of the right-lower field reveals rales. Examination of the left-lower field reveals rales. Rales (clear w/ deep inspiration) present. No wheezing.  Abdominal:     General: There is no distension.     Palpations: Abdomen is soft.     Tenderness: There is no abdominal tenderness.  Skin:    General: Skin is warm.     Capillary Refill: Capillary refill takes less than 2 seconds.  Neurological:     Mental Status: He is alert and oriented to person, place, and time.     Motor: No abnormal muscle tone.      ED Treatments / Results  Labs (all labs ordered are listed, but only abnormal results are displayed) Labs Reviewed  CBC WITH DIFFERENTIAL/PLATELET - Abnormal; Notable for the following  components:      Result Value   Hemoglobin 12.4 (*)    All other components within normal limits  BASIC METABOLIC PANEL - Abnormal; Notable for the following components:   Glucose, Bld 118 (*)    All other components within normal limits    EKG EKG Interpretation  Date/Time:  Sunday July 29 2018 10:39:23 EDT Ventricular Rate:  76 PR Interval:    QRS Duration: 93 QT Interval:  369 QTC Calculation: 415 R Axis:   83 Text Interpretation:  Sinus rhythm Inferior infarct, age indeterminate Baseline wander in lead(s) II V3 No significant change since last tracing Confirmed by Duffy Bruce [phone]) on 07/29/2018 10:54:25 AM   Radiology Dg Chest 2 View  Result Date: 07/29/2018 CLINICAL DATA:  Abscess on back of neck, feeling of something stuck in throat, shortness of breath, headache EXAM: CHEST - 2 VIEW COMPARISON:  01/26/2015 FINDINGS: The heart size and mediastinal contours are within normal limits. Both lungs are clear. The visualized skeletal structures are unremarkable. IMPRESSION: No acute abnormality of the lungs. Electronically Signed   By: Eddie Candle M.D.   On: 07/29/2018 10:44    Procedures Procedures (including critical care time)  Medications Ordered in ED Medications  doxycycline (VIBRA-TABS) tablet 100 mg (100 mg Oral Given 07/29/18 1037)     Initial Impression / Assessment and Plan / ED Course  I have reviewed the triage vital signs and the nursing notes.  Pertinent labs & imaging results that were available during my care of the patient were reviewed by me and considered in my medical decision making (see chart for details).  Clinical Course as of Jul 28 1453  Sun Jul 29, 1467  285 58 year old male here with concern for possible exposure to septic tank spillage.  Patient does report sore throat, cough, and has some scattered rales and clear with coughing on my exam.  I suspect this is actually more so secondary to viral bronchitis versus early bacterial bronchitis,  and he has no hypoxia or signs of focal pneumonia on chest x-ray.  His lab work and exam as well as vitals are otherwise very reassuring.  No known toxin exposure.  Regarding his neck swelling, his exam is consistent with chronic folliculitis with possible component of hidradenitis as well.  There is no focal area of fluctuance or drainable abscess at this time.  Patient will be started on doxycycline, encouraged him to contact his landlord and avoid direct exposure, and hand hygiene emphasized.   [CI]    Clinical Course User Index [CI] Duffy Bruce, MD       Final Clinical Impressions(s) / ED Diagnoses   Final diagnoses:  Folliculitis  Bronchitis    ED Discharge Orders         Ordered    doxycycline (VIBRAMYCIN) 100 MG capsule  2 times daily     07/29/18 1144           Duffy Bruce, MD 07/29/18 1455

## 2018-07-29 NOTE — ED Triage Notes (Signed)
Patient reports he has an abcess on the back of his neck that has been recurring as well as feeling like something is stuck in his throat. He states he had a sewage pump leak at his home and is concerned this is related to his symptoms. resp e/u, nad.

## 2018-08-03 ENCOUNTER — Encounter: Payer: Self-pay | Admitting: Internal Medicine

## 2018-08-05 ENCOUNTER — Encounter: Payer: Self-pay | Admitting: Internal Medicine

## 2018-08-08 ENCOUNTER — Ambulatory Visit (INDEPENDENT_AMBULATORY_CARE_PROVIDER_SITE_OTHER): Payer: Self-pay | Admitting: Bariatrics

## 2018-08-28 ENCOUNTER — Telehealth: Payer: Self-pay | Admitting: *Deleted

## 2018-08-28 NOTE — Telephone Encounter (Signed)
Called pt, no answer, left message for the patient to call us back to reschedule the colonoscopy for a later date.

## 2018-09-18 ENCOUNTER — Encounter: Payer: Medicare Other | Admitting: Internal Medicine

## 2018-09-26 ENCOUNTER — Telehealth: Payer: Self-pay | Admitting: *Deleted

## 2018-09-26 NOTE — Telephone Encounter (Signed)
Called pt to reschedule postponed procedure from April.  He wants to call back to reschedule.  Declined an office visit to discuss.  We will plan to send a reminder letter in June.

## 2018-10-17 ENCOUNTER — Ambulatory Visit: Payer: Medicaid Other | Admitting: *Deleted

## 2018-10-17 ENCOUNTER — Other Ambulatory Visit: Payer: Self-pay

## 2018-10-17 ENCOUNTER — Telehealth: Payer: Self-pay | Admitting: *Deleted

## 2018-10-17 NOTE — Telephone Encounter (Signed)
Call placed to do pv,when pt. Answered phone he stated"I just want to go ahead and cancel procedure because my insurance has change and I am not going to be able to pay for it anyway",asked pt.if he has  changed jobs and he stated "yes". Encourage pt. To follow up with care d/t his history,he verbalized understanding.

## 2018-10-22 ENCOUNTER — Encounter: Payer: Medicare Other | Admitting: Internal Medicine

## 2018-10-24 ENCOUNTER — Encounter: Payer: Self-pay | Admitting: Internal Medicine

## 2018-11-13 ENCOUNTER — Encounter: Payer: Self-pay | Admitting: Internal Medicine

## 2019-06-12 NOTE — Progress Notes (Signed)
HPI Alexander Mack is a 59 y.o. with a history of nonobstructive CAD by cardiac catheterization in 2007 and negative Myoview in 2012  He also has a hx of, DM2, HL, sleep apnea, obesity.  I last saw the in 2019    The pt denies CP   Breathing is good   No palpitatiosn  No dizziness A1C is 6.4    Walking on treadmill    No complaints   Allergies  Allergen Reactions  . Other Nausea And Vomiting, Palpitations and Shortness Of Breath  . Shellfish Allergy Shortness Of Breath, Nausea Only and Palpitations  . Latex Rash and Hives    Current Outpatient Medications  Medication Sig Dispense Refill  . aspirin 81 MG tablet Take 81 mg by mouth daily. Reported on 07/23/2015    . atorvastatin (LIPITOR) 20 MG tablet Take 20 mg by mouth daily.      . divalproex (DEPAKOTE ER) 500 MG 24 hr tablet Take 500 mg by mouth at bedtime.    . ergocalciferol (VITAMIN D2) 50000 units capsule Take 50,000 Units by mouth once a week. Reported on 10/08/2015    . glimepiride (AMARYL) 4 MG tablet Take 4 mg by mouth at bedtime.     . metFORMIN (GLUCOPHAGE) 1000 MG tablet Take 1,000 mg by mouth 2 (two) times daily with a meal.    . oxybutynin (DITROPAN) 5 MG tablet Take 5 mg by mouth 2 (two) times daily.  5  . VIIBRYD 40 MG TABS Take 40 mg by mouth daily.     No current facility-administered medications for this visit.    Past Medical History:  Diagnosis Date  . Alcohol abuse   . Anxiety   . Anxiety and depression   . Arthritis   . CAD (coronary artery disease)    a. 11.2007 Cath: LM 43mm, nl, LAD 30-13m, D2 30-40, LCX 20-30p, RCA nl, EF 50%;  b. 10.2012 Myoview: no isch/scar.  . Cataract   . Chronic pain    due to slipped disk, arthritis  . Depression   . Diabetes mellitus   . 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 Drug dependence   . Dyslipidemia   . Glaucoma   . History of arthroscopy   . History of cardiac cath   . Nonspecific abnormal results of liver function study   .  Obese   . Personal history of noncompliance with medical treatment, presenting hazards to health   . Plantar fascial fibromatosis   . Sexually transmitted disease   . Sleep apnea    wears cpap  . Unspecified essential hypertension     Past Surgical History:  Procedure Laterality Date  . APPENDECTOMY    . CARDIAC CATHETERIZATION  2007  . COLONOSCOPY    . POLYPECTOMY      Family History  Problem Relation Age of Onset  . Heart disease Mother 18  . Diabetes Mother   . Diabetes Brother   . Stomach cancer Brother   . Colon polyps Maternal Aunt   . Prostate cancer Paternal Grandmother   . Colon cancer Neg Hx   . Rectal cancer Neg Hx     Social History   Socioeconomic History  . Marital status: Single    Spouse name: Not on file  . Number of children: Not on file  . Years of education: Not on file  . Highest education level: Not on file  Occupational History  . Not on file  Tobacco Use  .  Smoking status: Former Smoker    Packs/day: 1.00    Years: 11.00    Pack years: 11.00    Types: Cigarettes    Quit date: 05/24/1983    Years since quitting: 36.0  . Smokeless tobacco: Former Systems developer    Quit date: 05/24/1988  Substance and Sexual Activity  . Alcohol use: No    Alcohol/week: 0.0 standard drinks    Comment: previously alcohol use, none since 2005  . Drug use: No    Comment: previously used cocaine, none since 2005  . Sexual activity: Yes  Other Topics Concern  . Not on file  Social History Narrative   Single. Lives with child. Disability.     Social Determinants of Health   Financial Resource Strain:   . Difficulty of Paying Living Expenses: Not on file  Food Insecurity:   . Worried About Charity fundraiser in the Last Year: Not on file  . Ran Out of Food in the Last Year: Not on file  Transportation Needs:   . Lack of Transportation (Medical): Not on file  . Lack of Transportation (Non-Medical): Not on file  Physical Activity:   . Days of Exercise per Week:  Not on file  . Minutes of Exercise per Session: Not on file  Stress:   . Feeling of Stress : Not on file  Social Connections:   . Frequency of Communication with Friends and Family: Not on file  . Frequency of Social Gatherings with Friends and Family: Not on file  . Attends Religious Services: Not on file  . Active Member of Clubs or Organizations: Not on file  . Attends Archivist Meetings: Not on file  . Marital Status: Not on file  Intimate Partner Violence:   . Fear of Current or Ex-Partner: Not on file  . Emotionally Abused: Not on file  . Physically Abused: Not on file  . Sexually Abused: Not on file    Review of Systems:  All systems reviewed.  They are negative to the above problem except as previously stated.  Vital Signs: BP 122/74   Pulse 89   Ht 5\' 6"  (1.676 m)   Wt 262 lb 12.8 oz (119.2 kg)   SpO2 95%   BMI 42.42 kg/m  Physical Exam Morbidly obese 59 yo in NAD   HEENT:  Normocephalic, atraumatic  Neck:  JVP is normal    Lungs: clear to auscultation. No rales  Heart: Regular rate and rhythm. Normal S1, S2. No S3.   No significant murmurs.   Abdomen:  Obese nontender. Normal bowel sounds.  No massess  Extremities:   Good distal pulses throughout. No lower extremity edema.   Musculoskeletal :moving all extremities.   Neuro:   alert and oriented x3.  CN II-XII grossly intact.    Assessment and Plan:  1  CAD   Remains symptom free   Mild dz     3.  HTN   BP is  Good   Continue to follow    3.  Hx GERD  No symptoms    4.  HL  Will get lipids to have in our system     5. Sleep apnea  Encouraged him to continue CPAP  6  Morbid obesity  Staying active     7.  COVID   Pt has not been sick  Encouraged him to get vaccine

## 2019-06-17 ENCOUNTER — Other Ambulatory Visit: Payer: Self-pay

## 2019-06-17 ENCOUNTER — Encounter: Payer: Self-pay | Admitting: Internal Medicine

## 2019-06-17 ENCOUNTER — Ambulatory Visit (INDEPENDENT_AMBULATORY_CARE_PROVIDER_SITE_OTHER): Payer: Medicare Other | Admitting: Internal Medicine

## 2019-06-17 VITALS — BP 122/74 | HR 89 | Ht 66.0 in | Wt 262.8 lb

## 2019-06-17 DIAGNOSIS — I2510 Atherosclerotic heart disease of native coronary artery without angina pectoris: Secondary | ICD-10-CM

## 2019-06-17 DIAGNOSIS — I10 Essential (primary) hypertension: Secondary | ICD-10-CM | POA: Diagnosis not present

## 2019-06-17 LAB — CBC
Hematocrit: 37.8 % (ref 37.5–51.0)
Hemoglobin: 12.5 g/dL — ABNORMAL LOW (ref 13.0–17.7)
MCH: 27.7 pg (ref 26.6–33.0)
MCHC: 33.1 g/dL (ref 31.5–35.7)
MCV: 84 fL (ref 79–97)
Platelets: 217 10*3/uL (ref 150–450)
RBC: 4.51 x10E6/uL (ref 4.14–5.80)
RDW: 13.3 % (ref 11.6–15.4)
WBC: 4.8 10*3/uL (ref 3.4–10.8)

## 2019-06-17 LAB — BASIC METABOLIC PANEL
BUN/Creatinine Ratio: 7 — ABNORMAL LOW (ref 9–20)
BUN: 7 mg/dL (ref 6–24)
CO2: 26 mmol/L (ref 20–29)
Calcium: 8.9 mg/dL (ref 8.7–10.2)
Chloride: 104 mmol/L (ref 96–106)
Creatinine, Ser: 0.95 mg/dL (ref 0.76–1.27)
GFR calc Af Amer: 101 mL/min/{1.73_m2} (ref >59)
GFR calc non Af Amer: 87 mL/min/{1.73_m2} (ref >59)
Glucose: 101 mg/dL — ABNORMAL HIGH (ref 65–99)
Potassium: 4.3 mmol/L (ref 3.5–5.2)
Sodium: 140 mmol/L (ref 134–144)

## 2019-06-17 LAB — LIPID PANEL
Chol/HDL Ratio: 3.7 ratio (ref 0.0–5.0)
Cholesterol, Total: 178 mg/dL (ref 100–199)
HDL: 48 mg/dL (ref >39)
LDL Chol Calc (NIH): 115 mg/dL — ABNORMAL HIGH (ref 0–99)
Triglycerides: 79 mg/dL (ref 0–149)
VLDL Cholesterol Cal: 15 mg/dL (ref 5–40)

## 2019-06-17 NOTE — Patient Instructions (Signed)
Medication Instructions:  No changes *If you need a refill on your cardiac medications before your next appointment, please call your pharmacy*  Lab Work: Today: bmet, cbc, lipids If you have labs (blood work) drawn today and your tests are completely normal, you will receive your results only by: Marland Kitchen MyChart Message (if you have MyChart) OR . A paper copy in the mail If you have any lab test that is abnormal or we need to change your treatment, we will call you to review the results.  Testing/Procedures: none  Follow-Up: At [facility], you and your health needs are our priority.  As part of our continuing mission to provide you with exceptional heart care, we have created designated Provider Care Teams.  These Care Teams include your primary Cardiologist (physician) and Advanced Practice Providers (APPs -  Physician Assistants and Nurse Practitioners) who all work together to provide you with the care you need, when you need it.  Your next appointment:   12 month(s)  The format for your next appointment:   Either In Person or Virtual  Provider:   You may see Dorris Carnes, MD or one of the following Advanced Practice Providers on your designated Care Team:    Richardson Dopp, PA-C  [place], [place]  Daune Perch, NP   Other Instructions

## 2019-07-10 ENCOUNTER — Telehealth: Payer: Self-pay | Admitting: *Deleted

## 2019-07-10 DIAGNOSIS — E782 Mixed hyperlipidemia: Secondary | ICD-10-CM

## 2019-07-10 DIAGNOSIS — I2510 Atherosclerotic heart disease of native coronary artery without angina pectoris: Secondary | ICD-10-CM

## 2019-07-10 MED ORDER — ATORVASTATIN CALCIUM 40 MG PO TABS: 40.0000 mg | ORAL_TABLET | Freq: Every day | ORAL | 3 refills | Status: DC

## 2019-07-10 NOTE — Addendum Note (Signed)
Addended by: Rodman Key on: 07/10/2019 05:31 PM   Modules accepted: Orders

## 2019-07-10 NOTE — Telephone Encounter (Signed)
-----   Message from Fay Records, MD sent at 06/17/2019  8:29 PM EST ----- CBC is OK Electrolytes and kidney function are OK Lipids :   LDL is 115   He is on 20 mg Lipitor   I would increase to 40 mg   Take in evening    F/U lipids in 8 wks

## 2019-07-10 NOTE — Telephone Encounter (Signed)
Reached patient on mobile number.  Informed of results/review and recommendations by Dr. Harrington Challenger.  He has been without Lipitor for "some months".  Aware to pick up new prescription at [facility] and take one tablet every evening. Lab appointment made for 09/10/19

## 2019-09-10 ENCOUNTER — Other Ambulatory Visit: Payer: Self-pay

## 2019-09-10 ENCOUNTER — Other Ambulatory Visit: Payer: Medicare Other

## 2019-09-10 DIAGNOSIS — E782 Mixed hyperlipidemia: Secondary | ICD-10-CM

## 2019-09-10 DIAGNOSIS — I2510 Atherosclerotic heart disease of native coronary artery without angina pectoris: Secondary | ICD-10-CM

## 2019-09-10 LAB — LIPID PANEL
Chol/HDL Ratio: 2.7 ratio (ref 0.0–5.0)
Cholesterol, Total: 126 mg/dL (ref 100–199)
HDL: 47 mg/dL (ref >39)
LDL Chol Calc (NIH): 65 mg/dL (ref 0–99)
Triglycerides: 65 mg/dL (ref 0–149)
VLDL Cholesterol Cal: 14 mg/dL (ref 5–40)

## 2020-06-15 ENCOUNTER — Encounter: Payer: Self-pay | Admitting: Internal Medicine

## 2020-06-15 ENCOUNTER — Ambulatory Visit (INDEPENDENT_AMBULATORY_CARE_PROVIDER_SITE_OTHER): Payer: Medicare Other | Admitting: Internal Medicine

## 2020-06-15 ENCOUNTER — Other Ambulatory Visit: Payer: Self-pay

## 2020-06-15 VITALS — BP 142/96 | HR 80 | Ht 66.0 in | Wt 250.4 lb

## 2020-06-15 DIAGNOSIS — I2510 Atherosclerotic heart disease of native coronary artery without angina pectoris: Secondary | ICD-10-CM | POA: Diagnosis not present

## 2020-06-15 NOTE — Patient Instructions (Signed)
Medication Instructions:  No changes *If you need a refill on your cardiac medications before your next appointment, please call your pharmacy*   Lab Work: none If you have labs (blood work) drawn today and your tests are completely normal, you will receive your results only by: . MyChart Message (if you have MyChart) OR . A paper copy in the mail If you have any lab test that is abnormal or we need to change your treatment, we will call you to review the results.   Testing/Procedures: none   Follow-Up: At [facility], you and your health needs are our priority.  As part of our continuing mission to provide you with exceptional heart care, we have created designated Provider Care Teams.  These Care Teams include your primary Cardiologist (physician) and Advanced Practice Providers (APPs -  Physician Assistants and Nurse Practitioners) who all work together to provide you with the care you need, when you need it.  We recommend signing up for the patient portal called "MyChart".  Sign up information is provided on this After Visit Summary.  MyChart is used to connect with patients for Virtual Visits (Telemedicine).  Patients are able to view lab/test results, encounter notes, upcoming appointments, etc.  Non-urgent messages can be sent to your provider as well.   To learn more about what you can do with MyChart, go to [email or web address]://www.mychart.com.    Your next appointment:   10 month(s)  The format for your next appointment:   In Person  Provider:   You may see Paula Ross, MD or one of the following Advanced Practice Providers on your designated Care Team:    Scott Weaver, PA-C  Vin Bhagat, PA-C   Other Instructions   

## 2020-06-15 NOTE — Progress Notes (Signed)
HPI Alexander Mack is a 60 y.o. with a history of nonobstructive CAD by cardiac catheterization in 2007 and negative Myoview in 2012  He also has a hx of, DM2, HL, sleep apnea, obesity.  I last saw the in Jan 2021    Pt denies CP   Breathing OK    Uses CPAP  Says he is tolerating it OK   Allergies  Allergen Reactions  . Other Nausea And Vomiting, Palpitations and Shortness Of Breath  . Shellfish Allergy Shortness Of Breath, Nausea Only and Palpitations  . Latex Rash and Hives    Current Outpatient Medications  Medication Sig Dispense Refill  . aspirin 81 MG tablet Take 81 mg by mouth daily. Reported on 07/23/2015    . atorvastatin (LIPITOR) 40 MG tablet Take 1 tablet (40 mg total) by mouth daily at 6 PM. 90 tablet 3  . divalproex (DEPAKOTE ER) 500 MG 24 hr tablet Take 500 mg by mouth at bedtime.    . metFORMIN (GLUCOPHAGE) 1000 MG tablet Take 1,000 mg by mouth 2 (two) times daily with a meal.    . oxybutynin (DITROPAN) 5 MG tablet Take 5 mg by mouth 2 (two) times daily.  5  . VIIBRYD 40 MG TABS Take 40 mg by mouth daily.    Marland Kitchen oxyCODONE (ROXICODONE) 15 MG immediate release tablet Take 15 mg by mouth every 6 (six) hours as needed for pain.     No current facility-administered medications for this visit.    Past Medical History:  Diagnosis Date  . Alcohol abuse   . Anxiety   . Anxiety and depression   . Arthritis   . CAD (coronary artery disease)    a. 11.2007 Cath: LM 46mm, nl, LAD 30-40m, D2 30-40, LCX 20-30p, RCA nl, EF 50%;  b. 10.2012 Myoview: no isch/scar.  . Cataract   . Chronic pain    due to slipped disk, arthritis  . Depression   . Diabetes mellitus   . 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 Drug dependence   . Dyslipidemia   . Glaucoma   . History of arthroscopy   . History of cardiac cath   . Nonspecific abnormal results of liver function study   . Obese   . Personal history of noncompliance with medical treatment, presenting  hazards to health   . Plantar fascial fibromatosis   . Sexually transmitted disease   . Sleep apnea    wears cpap  . Unspecified essential hypertension     Past Surgical History:  Procedure Laterality Date  . APPENDECTOMY    . CARDIAC CATHETERIZATION  2007  . COLONOSCOPY    . POLYPECTOMY      Family History  Problem Relation Age of Onset  . Heart disease Mother 31  . Diabetes Mother   . Diabetes Brother   . Stomach cancer Brother   . Colon polyps Maternal Aunt   . Prostate cancer Paternal Grandmother   . Colon cancer Neg Hx   . Rectal cancer Neg Hx     Social History   Socioeconomic History  . Marital status: Single    Spouse name: Not on file  . Number of children: Not on file  . Years of education: Not on file  . Highest education level: Not on file  Occupational History  . Not on file  Tobacco Use  . Smoking status: Former Smoker    Packs/day: 1.00    Years: 11.00    Pack  years: 11.00    Types: Cigarettes    Quit date: 05/24/1983    Years since quitting: 37.0  . Smokeless tobacco: Former Systems developer    Quit date: 05/24/1988  Substance and Sexual Activity  . Alcohol use: No    Alcohol/week: 0.0 standard drinks    Comment: previously alcohol use, none since 2005  . Drug use: No    Comment: previously used cocaine, none since 2005  . Sexual activity: Yes  Other Topics Concern  . Not on file  Social History Narrative   Single. Lives with child. Disability.     Social Determinants of Health   Financial Resource Strain: Not on file  Food Insecurity: Not on file  Transportation Needs: Not on file  Physical Activity: Not on file  Stress: Not on file  Social Connections: Not on file  Intimate Partner Violence: Not on file    Review of Systems:  All systems reviewed.  They are negative to the above problem except as previously stated.  Vital Signs: BP (!) 142/96   Pulse 80   Ht 5\' 6"  (1.676 m)   Wt 250 lb 6.4 oz (113.6 kg)   BMI 40.42 kg/m  Physical  Exam Morbidly obese 60 yo in NAD   HEENT:  Normocephalic, atraumatic  Neck:  JVP is not elevated   No bruits   Lungs: clear to auscultation. No rales  Heart: Regular rate and rhythm. Normal S1, S2. No S3.   No significant murmurs.   Abdomen:  Obese nontender. Normal bowel sounds  Extremities:   Good distal pulses throughout. No lower extremity edema.   Musculoskeletal :moving all extremities.   Neuro:   alert and oriented x3.  CN II-XII grossly intact.  EKG  SR 80 bpm    Assessment and Plan:  1  CAD   Mild CAD   Pt remains symptom free   Follow     3.  HTN   BP is high today  Usually better he says  I would not make any changes   He has an appt with Dr Mellody Drown soon    3.  Hx GERD  No symptoms     4.  HL  LDL in April 2021 is 65  HDL 47   5. Sleep apnea  Tolerating CPAP  Contnue  6  Morbid obesity  Staying active    7  DM   Discussed diet   Breakfast 9 AM Wheaties  Lunch  Skips  Dinner 5PM  Eats at home or fast foods  No sugar drinks Discussed portion size and also trime restricted eating

## 2020-07-21 ENCOUNTER — Other Ambulatory Visit: Payer: Self-pay | Admitting: Internal Medicine

## 2021-07-06 ENCOUNTER — Other Ambulatory Visit: Payer: Self-pay | Admitting: Internal Medicine

## 2021-07-31 ENCOUNTER — Other Ambulatory Visit: Payer: Self-pay | Admitting: Internal Medicine

## 2021-08-20 ENCOUNTER — Other Ambulatory Visit: Payer: Self-pay | Admitting: Internal Medicine

## 2021-09-02 ENCOUNTER — Other Ambulatory Visit: Payer: Self-pay | Admitting: Internal Medicine

## 2022-07-03 ENCOUNTER — Other Ambulatory Visit: Payer: Self-pay | Admitting: Internal Medicine

## 2022-12-15 ENCOUNTER — Other Ambulatory Visit (HOSPITAL_COMMUNITY): Payer: Self-pay

## 2022-12-15 MED ORDER — MOUNJARO 12.5 MG/0.5ML ~~LOC~~ SOAJ
12.5000 mg | SUBCUTANEOUS | 0 refills | Status: AC
  Filled 2022-12-15: qty 2, 28d supply, fill #0

## 2023-02-14 NOTE — Progress Notes (Deleted)
Cardiology Office Note    Patient Name: Alexander Mack Date of Encounter: 02/14/2023  Primary Care Provider:  Ralene Ok, MD Primary Cardiologist:  Dietrich Pates, MD Primary Electrophysiologist: None   Past Medical History    Past Medical History:  Diagnosis Date   Alcohol abuse    Anxiety    Anxiety and depression    Arthritis    CAD (coronary artery disease)    a. 11.2007 Cath: LM 6mm, nl, LAD 30-74m, D2 30-40, LCX 20-30p, RCA nl, EF 50%;  b. 10.2012 Myoview: no isch/scar.   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    Drug dependence    Dyslipidemia    Glaucoma    History of arthroscopy    History of cardiac cath    Nonspecific abnormal results of liver function study    Obese    Personal history of noncompliance with medical treatment, presenting hazards to health    Plantar fascial fibromatosis    Sexually transmitted disease    Sleep apnea    wears cpap   Unspecified essential hypertension     History of Present Illness  Alexander Mack is a 62 y.o. male with a PMH of CAD s/p LHC 2007 with nonobstructive disease, HLD, DM type II, HTN, OSA, obesity who presents today for overdue follow-up.  Alexander Mack been followed by Dr. Tenny Craw since the early 2020 was seen for chest pain and underwent a LHC that showed (LAD showed a 30-40% distal narrowing. Circumflex had a 20-30% proximal narrowing. D2 had a 30 to 40% narrowing).  He was seen again in 2012 with complaint of chest pain and underwent Myoview that was negative.  He was last seen by Dr. Tenny Craw on 05/2020 and reported no chest pain and BP was noted to be elevated but patient declined changes to his medication at that time.  He was tolerating CPAP therapy due to OSA.  During today's visit the patient reports*** .  Patient denies chest pain, palpitations, dyspnea, PND, orthopnea, nausea, vomiting,  dizziness, syncope, edema, weight gain, or early satiety.  ***Notes: -Last ischemic evaluation: -Last echo: -Interim ED visits: Review of Systems  Please see the history of present illness.    All other systems reviewed and are otherwise negative except as noted above.  Physical Exam    Wt Readings from Last 3 Encounters:  06/15/20 250 lb 6.4 oz (113.6 kg)  06/17/19 262 lb 12.8 oz (119.2 kg)  07/29/18 280 lb (127 kg)   ZO:[id number] were no vitals filed for this visit.,There is no height or weight on file to calculate BMI. GEN: Well nourished, well developed in no acute distress Neck: No JVD; No carotid bruits Pulmonary: Clear to auscultation without rales, wheezing or rhonchi  Cardiovascular: Normal rate. Regular rhythm. Normal S1. Normal S2.   Murmurs: There is no murmur.  ABDOMEN: Soft, non-tender, non-distended EXTREMITIES:  No edema; No deformity   EKG/LABS/ Recent Cardiac Studies   ECG personally reviewed by me today - ***  Risk Assessment/Calculations:   {Does this patient have ATRIAL FIBRILLATION?:[id number]}      Lab Results  Component Value Date   WBC 4.8 06/17/2019   HGB 12.5 (L) 06/17/2019   HCT 37.8 06/17/2019   MCV 84 06/17/2019   PLT 217 06/17/2019   Lab Results  Component Value Date   CREATININE 0.95 06/17/2019  BUN 7 06/17/2019   NA 140 06/17/2019   K 4.3 06/17/2019   CL 104 06/17/2019   CO2 26 06/17/2019   Lab Results  Component Value Date   CHOL 126 09/10/2019   HDL 47 09/10/2019   LDLCALC 65 09/10/2019   TRIG 65 09/10/2019   CHOLHDL 2.7 09/10/2019    Lab Results  Component Value Date   HGBA1C 7.1 (H) 01/17/2013   Assessment & Plan    1.  CAD: -s/p LHC 2007 with nonobstructive disease present and last ischemic evaluation 2012 by Myoview that was normal -Today patient reports***  2.  Essential hypertension: -Patient's blood pressure today was***  3.  Hyperlipidemia: -Patient's last LDL cholesterol was***  4. Obstructive sleep  apnea: -Patient reports***  5.  Morbid obesity: -Patient's BMI is***      Disposition: Follow-up with Dietrich Pates, MD or APP in *** months {Are you ordering a CV Procedure (e.g. stress test, cath, DCCV, TEE, etc)?   Press F2        :[id number]}   Signed, Napoleon Form, Leodis Rains, NP 02/14/2023, 8:16 AM  [facility]

## 2023-02-15 ENCOUNTER — Ambulatory Visit: Payer: 59 | Attending: Nurse Practitioner | Admitting: Nurse Practitioner

## 2023-02-15 DIAGNOSIS — G4730 Sleep apnea, unspecified: Secondary | ICD-10-CM

## 2023-02-15 DIAGNOSIS — E6609 Other obesity due to excess calories: Secondary | ICD-10-CM

## 2023-02-15 DIAGNOSIS — E785 Hyperlipidemia, unspecified: Secondary | ICD-10-CM

## 2023-02-15 DIAGNOSIS — I2510 Atherosclerotic heart disease of native coronary artery without angina pectoris: Secondary | ICD-10-CM

## 2023-02-15 DIAGNOSIS — I10 Essential (primary) hypertension: Secondary | ICD-10-CM

## 2023-02-16 ENCOUNTER — Encounter: Payer: Self-pay | Admitting: Nurse Practitioner

## 2023-06-18 NOTE — Progress Notes (Unsigned)
HPI Alexander Mack is a 63 y.o. with a history of nonobstructive CAD by cardiac catheterization in 2007 and negative Myoview in 2012  He also has a hx of, DM2, HL, sleep apnea, obesity.     I last saw the pt in 2022  Since seen he says he has been doing well   Denies CP  No SOB  No palpitations      He has had a couple episodes of syncope  Both occurred with rapid standing after being seated for a long time   No palpitations   Allergies  Allergen Reactions   Other Nausea And Vomiting, Palpitations and Shortness Of Breath   Shellfish Allergy Shortness Of Breath, Nausea Only and Palpitations   Latex Rash and Hives    Current Outpatient Medications  Medication Sig Dispense Refill   aspirin 81 MG tablet Take 81 mg by mouth daily. Reported on 07/23/2015     atorvastatin (LIPITOR) 40 MG tablet Take 1 tablet (40 mg total) by mouth daily. Please make overdue appt with Dr. Tenny Craw before anymore refills. Thank you 3rd and Final Attempt 15 tablet 0   divalproex (DEPAKOTE ER) 500 MG 24 hr tablet Take 500 mg by mouth at bedtime.     oxybutynin (DITROPAN) 5 MG tablet Take 5 mg by mouth 2 (two) times daily.  5   oxyCODONE (ROXICODONE) 15 MG immediate release tablet Take 15 mg by mouth every 6 (six) hours as needed for pain.     tirzepatide (MOUNJARO) 12.5 MG/0.5ML Pen Inject 12.5 mg into the skin once a week. 2 mL 0   VIIBRYD 40 MG TABS Take 40 mg by mouth daily.     No current facility-administered medications for this visit.    Past Medical History:  Diagnosis Date   Alcohol abuse    Anxiety    Anxiety and depression    Arthritis    CAD (coronary artery disease)    a. 11.2007 Cath: LM 6mm, nl, LAD 30-29m, D2 30-40, LCX 20-30p, RCA nl, EF 50%;  b. 10.2012 Myoview: no isch/scar.   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    Drug dependence    Dyslipidemia    Glaucoma     History of arthroscopy    History of cardiac cath    Nonspecific abnormal results of liver function study    Obese    Personal history of noncompliance with medical treatment, presenting hazards to health    Plantar fascial fibromatosis    Sexually transmitted disease    Sleep apnea    wears cpap   Unspecified essential hypertension     Past Surgical History:  Procedure Laterality Date   APPENDECTOMY     CARDIAC CATHETERIZATION  2007   COLONOSCOPY     POLYPECTOMY      Family History  Problem Relation Age of Onset   Heart disease Mother 72   Diabetes Mother    Diabetes Brother    Stomach cancer Brother    Colon polyps Maternal Aunt    Prostate cancer Paternal Grandmother    Colon cancer Neg Hx    Rectal cancer Neg Hx     Social History   Socioeconomic History   Marital status: Single    Spouse name: Not on file   Number of children: Not on file   Years of education:  Not on file   Highest education level: Not on file  Occupational History   Not on file  Tobacco Use   Smoking status: Former    Current packs/day: 0.00    Average packs/day: 1 pack/day for 11.0 years (11.0 ttl pk-yrs)    Types: Cigarettes    Start date: 05/23/1972    Quit date: 05/24/1983    Years since quitting: 40.1   Smokeless tobacco: Former    Quit date: 05/24/1988  Substance and Sexual Activity   Alcohol use: No    Alcohol/week: 0.0 standard drinks of alcohol    Comment: previously alcohol use, none since 2005   Drug use: No    Comment: previously used cocaine, none since 2005   Sexual activity: Yes  Other Topics Concern   Not on file  Social History Narrative   Single. Lives with child. Disability.     Social Drivers of Corporate investment banker Strain: Not on file  Food Insecurity: Not on file  Transportation Needs: Not on file  Physical Activity: Not on file  Stress: Not on file  Social Connections: Unknown (10/03/2021)   [facility] from [facility]   Social  Network    Social Network: Not on file  Intimate Partner Violence: Unknown (08/26/2021)   Received from [facility], [facility]   HITS    Physically Hurt: Not on file    Insult or Talk Down To: Not on file    Threaten Physical Harm: Not on file    Scream or Curse: Not on file    Review of Systems:  All systems reviewed.  They are negative to the above problem except as previously stated.  Vital Signs: BP 126/80   Pulse 94   Ht 5\' 6"  (1.676 m)   Wt 209 lb 6.4 oz (95 kg)   SpO2 97%   BMI 33.80 kg/m  Physical Exam Obese 63 yo in NAD   HEENT:  Normocephalic, atraumatic  Neck:  JVP is not elevated  No bruit Lungs: clear to auscultation. Heart: Regular rate and rhythm.  No murmur   Abdomen:  Nontender  No masses  Extremities:   Good distal pulses throughout. No lower extremity edema.   EKG  SR 93 bpm    Assessment and Plan:  1  CAD   Remote cath   Mild dz   No symptoms of angina   2  Syncope   Last spell about 7 months ago  Both episodes occurred with quick standing   REcomm avoid rapid changes after prolonged sitting    Stay hydrated Move    3.  HTN  BP is well controlled   4.  HL Will check labs   Continue atorvastatin for now   5. Sleep apnea  Continue CPAP    6  Morbid obesity On Mounjaro      7  DM   Will check A1C    Reviewed diet     Follow up in 1 year

## 2023-06-20 ENCOUNTER — Ambulatory Visit: Payer: 59 | Attending: Internal Medicine | Admitting: Internal Medicine

## 2023-06-20 VITALS — BP 126/80 | HR 94 | Ht 66.0 in | Wt 209.4 lb

## 2023-06-20 DIAGNOSIS — I2089 Other forms of angina pectoris: Secondary | ICD-10-CM

## 2023-06-20 DIAGNOSIS — I2510 Atherosclerotic heart disease of native coronary artery without angina pectoris: Secondary | ICD-10-CM

## 2023-06-20 DIAGNOSIS — G4733 Obstructive sleep apnea (adult) (pediatric): Secondary | ICD-10-CM | POA: Diagnosis not present

## 2023-06-20 NOTE — Patient Instructions (Signed)
Medication Instructions:   *If you need a refill on your cardiac medications before your next appointment, please call your pharmacy*   Lab Work: NMR, APO B, LIPO A, HGBA1C, CMET, CBC  If you have labs (blood work) drawn today and your tests are completely normal, you will receive your results only by: MyChart Message (if you have MyChart) OR A paper copy in the mail If you have any lab test that is abnormal or we need to change your treatment, we will call you to review the results.   Testing/Procedures:    Follow-Up: At [facility], you and your health needs are our priority.  As part of our continuing mission to provide you with exceptional heart care, we have created designated Provider Care Teams.  These Care Teams include your primary Cardiologist (physician) and Advanced Practice Providers (APPs -  Physician Assistants and Nurse Practitioners) who all work together to provide you with the care you need, when you need it.  We recommend signing up for the patient portal called "MyChart".  Sign up information is provided on this After Visit Summary.  MyChart is used to connect with patients for Virtual Visits (Telemedicine).  Patients are able to view lab/test results, encounter notes, upcoming appointments, etc.  Non-urgent messages can be sent to your provider as well.   To learn more about what you can do with MyChart, go to [email or web address].    Your next appointment:   ONE YEAR

## 2023-06-21 LAB — CBC
Hematocrit: 42.4 % (ref 37.5–51.0)
Hemoglobin: 13.9 g/dL (ref 13.0–17.7)
MCH: 28.4 pg (ref 26.6–33.0)
MCHC: 32.8 g/dL (ref 31.5–35.7)
MCV: 87 fL (ref 79–97)
Platelets: 198 10*3/uL (ref 150–450)
RBC: 4.89 x10E6/uL (ref 4.14–5.80)
RDW: 12.8 % (ref 11.6–15.4)
WBC: 5.2 10*3/uL (ref 3.4–10.8)

## 2023-06-21 LAB — BASIC METABOLIC PANEL
BUN/Creatinine Ratio: 8 — ABNORMAL LOW (ref 10–24)
BUN: 9 mg/dL (ref 8–27)
CO2: 28 mmol/L (ref 20–29)
Calcium: 9.2 mg/dL (ref 8.6–10.2)
Chloride: 100 mmol/L (ref 96–106)
Creatinine, Ser: 1.2 mg/dL (ref 0.76–1.27)
Glucose: 79 mg/dL (ref 70–99)
Potassium: 4.3 mmol/L (ref 3.5–5.2)
Sodium: 141 mmol/L (ref 134–144)
eGFR: 68 mL/min/{1.73_m2} (ref >59)

## 2023-06-21 LAB — NMR, LIPOPROFILE
Cholesterol, Total: 119 mg/dL (ref 100–199)
HDL Particle Number: 29.9 umol/L — ABNORMAL LOW (ref >=30.5)
HDL-C: 51 mg/dL (ref >39)
LDL Particle Number: 612 nmol/L (ref <1000)
LDL Size: 20.7 nmol (ref >20.5)
LDL-C (NIH Calc): 58 mg/dL (ref 0–99)
LP-IR Score: <25 (ref <=45)
Small LDL Particle Number: 289 nmol/L (ref <=527)
Triglycerides: 41 mg/dL (ref 0–149)

## 2023-06-21 LAB — HEMOGLOBIN A1C
Est. average glucose Bld gHb Est-mCnc: 111 mg/dL
Hgb A1c MFr Bld: 5.5 % (ref 4.8–5.6)

## 2023-06-21 LAB — LIPOPROTEIN A (LPA): Lipoprotein (a): 206.8 nmol/L — ABNORMAL HIGH (ref <75.0)

## 2023-06-21 LAB — APOLIPOPROTEIN B: Apolipoprotein B: 61 mg/dL (ref <90)

## 2023-11-10 ENCOUNTER — Encounter: Payer: Self-pay | Admitting: Podiatry

## 2023-11-10 ENCOUNTER — Ambulatory Visit (INDEPENDENT_AMBULATORY_CARE_PROVIDER_SITE_OTHER): Admitting: Podiatry

## 2023-11-10 DIAGNOSIS — E1140 Type 2 diabetes mellitus with diabetic neuropathy, unspecified: Secondary | ICD-10-CM

## 2023-11-10 DIAGNOSIS — G5791 Unspecified mononeuropathy of right lower limb: Secondary | ICD-10-CM | POA: Diagnosis not present

## 2023-11-10 DIAGNOSIS — G5792 Unspecified mononeuropathy of left lower limb: Secondary | ICD-10-CM | POA: Diagnosis not present

## 2023-11-10 MED ORDER — GABAPENTIN 300 MG PO CAPS: 300.0000 mg | ORAL_CAPSULE | Freq: Three times a day (TID) | ORAL | 6 refills | Status: DC

## 2023-11-10 NOTE — Addendum Note (Signed)
 Addended by: Reche Canales on: 11/10/2023 09:14 AM   Modules accepted: Orders

## 2023-11-10 NOTE — Progress Notes (Signed)
 Presents today for follow-up of neuropathy.  He has been doing well on the gabapentin 300 mg p.o. 3 times daily.  Controls the neuropathy discomfort probably 90-95% of the time.  Tolerating gabapentin well.  He is a type II diabetic   Physical exam:  General appearance: Pleasant, and in no acute distress. AOx3.  Vascular: Pedal pulses: DP 2/4 bilaterally, PT 2/4 bilaterally.  Mild edema lower legs bilaterally. Capillary fill time immediate.  Neurological: Vibratory sensation diminished bilateral feet.  Reduced Achilles reflex bilaterally.  Diminished monofilament sensation toes 1 through 5 bilaterally.  Paresthesias and burning noted.  Dermatologic:   Skin normal temperature bilaterally.  Decreased hair growth lower extremity with atrophy of skin and some areas of hyperpigmentation bilaterally  Musculoskeletal: Hammertoes 2 through 5 bilaterally  Diagnosis: 1.  Neuritis feet bilaterally.   2.  Diabetes mellitus type 2 with neuropathy  Plan: -Established office visit level 3 for evaluation and management. - Doing well on current dose of gabapentin for controlling symptoms of neuropathy.  Is tolerating the medicine well.  Only has any change in symptoms between now and his next appointment to call us .  Again discussed with him the importance of keeping his diabetes under control -Rx gabapentin 300 mg, 1 p.o. 3 times daily, 6 refills  Return 6 months follow-up diabetic neuropathy

## 2023-11-17 ENCOUNTER — Ambulatory Visit (INDEPENDENT_AMBULATORY_CARE_PROVIDER_SITE_OTHER): Admitting: Podiatry

## 2023-11-17 ENCOUNTER — Encounter: Payer: Self-pay | Admitting: Podiatry

## 2023-11-17 DIAGNOSIS — M79672 Pain in left foot: Secondary | ICD-10-CM

## 2023-11-17 DIAGNOSIS — B351 Tinea unguium: Secondary | ICD-10-CM

## 2023-11-17 DIAGNOSIS — E1151 Type 2 diabetes mellitus with diabetic peripheral angiopathy without gangrene: Secondary | ICD-10-CM | POA: Diagnosis not present

## 2023-11-17 DIAGNOSIS — M79671 Pain in right foot: Secondary | ICD-10-CM

## 2023-11-17 DIAGNOSIS — I70209 Unspecified atherosclerosis of native arteries of extremities, unspecified extremity: Secondary | ICD-10-CM

## 2023-11-17 NOTE — Progress Notes (Signed)
 Patient presents for evaluation and treatment of tenderness and some redness around nails feet.  Tenderness around toes with walking and wearing shoes.  Physical exam:  General appearance: Alert, pleasant, and in no acute distress.  Vascular: Pedal pulses: DP 2/4 B/L, PT 0/4 B/L. moderate edema lower legs bilaterally  Neurological:    Dermatologic:  Nails thickened, disfigured, discolored 1-5 BL with subungual debris.  Redness and hypertrophic nail folds along nail folds bilaterally but no signs of drainage or infection.  Musculoskeletal:     Diagnosis: 1. Painful onychomycotic nails 1 through 5 bilaterally. 2. Pain toes 1 through 5 bilaterally. 3.  Diabetes mellitus type 2 with PVD  Plan: Debrided onychomycotic nails 1 through 5 bilaterally.  Return 3 months

## 2024-02-20 ENCOUNTER — Ambulatory Visit (INDEPENDENT_AMBULATORY_CARE_PROVIDER_SITE_OTHER): Admitting: Podiatry

## 2024-02-20 ENCOUNTER — Encounter: Payer: Self-pay | Admitting: Podiatry

## 2024-02-20 ENCOUNTER — Ambulatory Visit: Admitting: Podiatry

## 2024-02-20 DIAGNOSIS — B351 Tinea unguium: Secondary | ICD-10-CM

## 2024-02-20 DIAGNOSIS — M79672 Pain in left foot: Secondary | ICD-10-CM

## 2024-02-20 DIAGNOSIS — M79671 Pain in right foot: Secondary | ICD-10-CM | POA: Diagnosis not present

## 2024-02-20 NOTE — Progress Notes (Signed)
 Patient presents for evaluation and treatment of tenderness and some redness around nails feet.  Tenderness around toes with walking and wearing shoes.  Physical exam:  General appearance: Alert, pleasant, and in no acute distress.  Vascular: Pedal pulses: DP 2/4 B/L, PT 0/4 B/L. Mild edema lower legs bilaterally  Neu  Dermatologic:  Nails thickened, disfigured, discolored 1-5 BL with subungual debris.  Redness and hypertrophic nail folds along nail folds bilaterally but no signs of drainage or infection.  Musculoskeletal:     Diagnosis: 1. Painful onychomycotic nails 1 through 5 bilaterally. 2. Pain toes 1 through 5 bilaterally.  Plan: -Debrided onychomycotic nails 1 through 5 bilaterally.  Sharply debrided nails with nail clipper and reduced with a power bur.  Return 3 months [facility]

## 2024-05-10 ENCOUNTER — Encounter: Payer: Self-pay | Admitting: Podiatry

## 2024-05-10 ENCOUNTER — Ambulatory Visit: Admitting: Podiatry

## 2024-05-10 DIAGNOSIS — M79671 Pain in right foot: Secondary | ICD-10-CM | POA: Diagnosis not present

## 2024-05-10 DIAGNOSIS — B351 Tinea unguium: Secondary | ICD-10-CM

## 2024-05-10 DIAGNOSIS — M79672 Pain in left foot: Secondary | ICD-10-CM

## 2024-05-10 MED ORDER — GABAPENTIN 300 MG PO CAPS: 300.0000 mg | ORAL_CAPSULE | Freq: Three times a day (TID) | ORAL | 6 refills | Status: AC

## 2024-05-10 NOTE — Progress Notes (Signed)
 Patient presents for evaluation and treatment of tenderness and some redness around nails feet.  Tenderness around toes with walking and wearing shoes.  Physical exam:  General appearance: Alert, pleasant, and in no acute distress.  Vascular: Pedal pulses: DP 2/4 B/L, PT 2/4 B/L.  Mild edema lower legs bilaterally.  Capillary refill time immediate bilaterally  Neurologic:  Dermatologic:  Nails thickened, disfigured, discolored 1-5 BL with subungual debris.  Redness and hypertrophic nail folds along nail folds bilaterally but no signs of drainage or infection.  Musculoskeletal:     Diagnosis: 1. Painful onychomycotic nails 1 through 5 bilaterally. 2. Pain toes 1 through 5 bilaterally. 3.  Neuritis feet bilaterally with diabetic neuropathy  Plan: -Debrided onychomycotic nails 1 through 5 bilaterally.  Sharply debrided nails with nail clipper and reduced with a power bur.  -Doing well on current dose of gabapentin  will continue 300 mg 1 p.o. 3 times daily. -Rx gabapentin  300 mg 1 p.o. 3 times daily, 3 refills  Return 3 months [facility]

## 2024-05-21 ENCOUNTER — Ambulatory Visit: Admitting: Podiatry

## 2024-06-21 ENCOUNTER — Encounter: Payer: Self-pay | Admitting: Internal Medicine

## 2024-06-21 ENCOUNTER — Ambulatory Visit: Attending: Internal Medicine | Admitting: Internal Medicine

## 2024-06-21 VITALS — BP 111/70 | HR 97 | Resp 17 | Ht 72.0 in | Wt 191.0 lb

## 2024-06-21 DIAGNOSIS — I2510 Atherosclerotic heart disease of native coronary artery without angina pectoris: Secondary | ICD-10-CM

## 2024-06-21 NOTE — Progress Notes (Signed)
 HPI Alexander Mack is a 64 y.o. with a history of nonobstructive CAD by cardiac catheterization in 2007 and negative Myoview  in 2012  He also has a hx of, DM2, HL, sleep apnea, obesity.     I last saw the pt in Jan 2025  At that visit he noted a couple episodes of syncope with quick standing  NO palpitations prior    The pt denies CP  Breathing is good  NO dizziness/syncope     Walking  3 flights of stairs   No problem Allergies  Allergen Reactions   Other Nausea And Vomiting, Palpitations and Shortness Of Breath   Shellfish Allergy Shortness Of Breath, Nausea Only and Palpitations   Latex Rash and Hives    Current Outpatient Medications  Medication Sig Dispense Refill   ARIPiprazole (ABILIFY) 5 MG tablet Take 5 mg by mouth daily.     atorvastatin  (LIPITOR) 40 MG tablet Take 1 tablet (40 mg total) by mouth daily. Please make overdue appt with Dr. Okey before anymore refills. Thank you 3rd and Final Attempt 15 tablet 0   divalproex (DEPAKOTE ER) 500 MG 24 hr tablet Take 500 mg by mouth at bedtime.     gabapentin  (NEURONTIN ) 300 MG capsule Take 1 capsule (300 mg total) by mouth 3 (three) times daily. 90 capsule 6   levothyroxine (SYNTHROID) 25 MCG tablet Take 25 mcg by mouth every morning.     oxybutynin (DITROPAN) 5 MG tablet Take 5 mg by mouth 2 (two) times daily.  5   oxyCODONE (ROXICODONE) 15 MG immediate release tablet Take 15 mg by mouth every 6 (six) hours as needed for pain.     testosterone cypionate (DEPOTESTOSTERONE CYPIONATE) 200 MG/ML injection Inject 200 mg into the muscle once a week.     tirzepatide  (MOUNJARO ) 12.5 MG/0.5ML Pen Inject 12.5 mg into the skin once a week. 2 mL 0   VIIBRYD 40 MG TABS Take 40 mg by mouth daily.     aspirin  81 MG tablet Take 81 mg by mouth daily. Reported on 07/23/2015 (Patient not taking: Reported on 06/21/2024)     No current facility-administered medications for this visit.    Past Medical History:  Diagnosis Date   Alcohol abuse     Anxiety    Anxiety and depression    Arthritis    CAD (coronary artery disease)    a. 11.2007 Cath: LM 6mm, nl, LAD 30-58m, D2 30-40, LCX 20-30p, RCA nl, EF 50%;  b. 10.2012 Myoview : no isch/scar.   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    Drug dependence    Dyslipidemia    Glaucoma    History of arthroscopy    History of cardiac cath    Nonspecific abnormal results of liver function study    Obese    Personal history of noncompliance with medical treatment, presenting hazards to health    Plantar fascial fibromatosis    Sexually transmitted disease    Sleep apnea    wears cpap   Unspecified essential hypertension     Past Surgical History:  Procedure Laterality Date   APPENDECTOMY     CARDIAC CATHETERIZATION  2007   COLONOSCOPY     POLYPECTOMY      Family History  Problem Relation Age of Onset   Heart disease Mother 62   Diabetes Mother  Diabetes Brother    Stomach cancer Brother    Colon polyps Maternal Aunt    Prostate cancer Paternal Grandmother    Colon cancer Neg Hx    Rectal cancer Neg Hx     Social History   Socioeconomic History   Marital status: Single    Spouse name: Not on file   Number of children: 2   Years of education: Not on file   Highest education level: Not on file  Occupational History   Not on file  Tobacco Use   Smoking status: Former    Current packs/day: 0.00    Average packs/day: 1 pack/day for 11.0 years (11.0 ttl pk-yrs)    Types: Cigarettes    Start date: 05/23/1972    Quit date: 05/24/1983    Years since quitting: 41.1   Smokeless tobacco: Former    Quit date: 05/24/1988  Vaping Use   Vaping status: Never Used  Substance and Sexual Activity   Alcohol use: No    Alcohol/week: 0.0 standard drinks of alcohol    Comment: previously alcohol use, none since 2005   Drug use: No    Comment: previously used  cocaine, none since 2005   Sexual activity: Yes  Other Topics Concern   Not on file  Social History Narrative   Single. Lives with child. Disability.     Social Drivers of Health   Tobacco Use: Medium Risk (06/21/2024)   Patient History    Smoking Tobacco Use: Former    Smokeless Tobacco Use: Former    Passive Exposure: Not on Stage Manager: Not on file  Food Insecurity: Not on file  Transportation Needs: Not on file  Physical Activity: Not on file  Stress: Not on file  Social Connections: Unknown (10/03/2021)   Received from [facility]   Social Network    Social Network: Not on file  Intimate Partner Violence: Unknown (08/26/2021)   Received from [facility]   HITS    Physically Hurt: Not on file    Insult or Talk Down To: Not on file    Threaten Physical Harm: Not on file    Scream or Curse: Not on file  Depression (EYV7-0): Not on file  Alcohol Screen: Not on file  Housing: Not on file  Utilities: Not on file  Health Literacy: Not on file    Review of Systems:  All systems reviewed.  They are negative to the above problem except as previously stated.  Vital Signs: BP 111/70 (BP Location: Left Arm, Patient Position: Sitting, Cuff Size: Normal)   Pulse 97   Resp 17   Ht 6' (1.829 m)   Wt 191 lb (86.6 kg)   SpO2 99%   BMI 25.90 kg/m  Physical Exam Pt in NAD  HEENT:  Normocephalic, atraumatic  Neck:  JVP is not elevated  No bruits Lungs: clear to auscultation. Heart: Regular rate and rhythm.  No murmur   Abdomen:  Nontender  No masses  Extremities:   No lower extremity edema.    EKG  SR 79 bpm      Assessment and Plan:  1  CAD   Remote cath 2007    Mild dz   No symptoms of angina Active  Follow  Off of ASA  2  Syncope   Occurred over one year ago   Sounded orthostatic  None since  Follow   3.  HTN  BP is well controlled off of meds   4.  HLWill recheck today    5. Sleep apnea  Continue CPAP    6  Morbid obesity On Mounjaro   Has  lost 60 lbs over the past few year        7  Hx of DM   Will check A1C    Diet:  Keep low on processed carbs  Eat lots of veggies   Follow up in 1 year   Sooner for problems

## 2024-06-21 NOTE — Patient Instructions (Signed)
 Medication Instructions:  No changes  *If you need a refill on your cardiac medications before your next appointment, please call your pharmacy*   Lab Work: Not needed    Testing/Procedures:  Not needed  Follow-Up: At [facility], you and your health needs are our priority.  As part of our continuing mission to provide you with exceptional heart care, we have created designated Provider Care Teams.  These Care Teams include your primary Cardiologist (physician) and Advanced Practice Providers (APPs -  Physician Assistants and Nurse Practitioners) who all work together to provide you with the care you need, when you need it.     Your next appointment:   12 month(s)  The format for your next appointment:   In Person  Provider:   Vina Gull, MD

## 2024-06-25 LAB — COMPREHENSIVE METABOLIC PANEL WITH GFR
ALT: 33 [IU]/L (ref 0–44)
AST: 35 [IU]/L (ref 0–40)
Albumin: 4 g/dL (ref 3.9–4.9)
Alkaline Phosphatase: 88 [IU]/L (ref 47–123)
BUN/Creatinine Ratio: 6 — ABNORMAL LOW (ref 10–24)
BUN: 10 mg/dL (ref 8–27)
Bilirubin Total: 0.4 mg/dL (ref 0.0–1.2)
CO2: 27 mmol/L (ref 20–29)
Calcium: 9.3 mg/dL (ref 8.6–10.2)
Chloride: 102 mmol/L (ref 96–106)
Creatinine, Ser: 1.56 mg/dL — ABNORMAL HIGH (ref 0.76–1.27)
Globulin, Total: 2.6 g/dL (ref 1.5–4.5)
Glucose: 89 mg/dL (ref 70–99)
Potassium: 4.3 mmol/L (ref 3.5–5.2)
Sodium: 144 mmol/L (ref 134–144)
Total Protein: 6.6 g/dL (ref 6.0–8.5)
eGFR: 49 mL/min/{1.73_m2} — ABNORMAL LOW

## 2024-06-25 LAB — NMR, LIPOPROFILE
Cholesterol, Total: 110 mg/dL (ref 100–199)
HDL Particle Number: 28.3 umol/L — AB
HDL-C: 51 mg/dL
LDL Particle Number: 511 nmol/L
LDL Size: 19.7 nm — AB
LDL-C (NIH Calc): 49 mg/dL (ref 0–99)
LP-IR Score: 33
Small LDL Particle Number: 342 nmol/L
Triglycerides: 37 mg/dL (ref 0–149)

## 2024-06-25 LAB — HEMOGLOBIN A1C
Est. average glucose Bld gHb Est-mCnc: 108 mg/dL
Hgb A1c MFr Bld: 5.4 % (ref 4.8–5.6)
